# Patient Record
Sex: Male | Born: 1965 | Race: White | Hispanic: No | Marital: Married | Smoking: Never smoker
Health system: Southern US, Community
[De-identification: ages and names within clinical notes are randomized; demographics above are authoritative.]

## PROBLEM LIST (undated history)

## (undated) DIAGNOSIS — R0600 Dyspnea, unspecified: Secondary | ICD-10-CM

## (undated) DIAGNOSIS — E039 Hypothyroidism, unspecified: Secondary | ICD-10-CM

## (undated) DIAGNOSIS — R0609 Other forms of dyspnea: Secondary | ICD-10-CM

## (undated) DIAGNOSIS — S46211A Strain of muscle, fascia and tendon of other parts of biceps, right arm, initial encounter: Secondary | ICD-10-CM

## (undated) DIAGNOSIS — E785 Hyperlipidemia, unspecified: Secondary | ICD-10-CM

## (undated) DIAGNOSIS — I10 Essential (primary) hypertension: Secondary | ICD-10-CM

## (undated) HISTORY — DX: Dyspnea, unspecified: R06.00

## (undated) HISTORY — DX: Other forms of dyspnea: R06.09

## (undated) HISTORY — DX: Hyperlipidemia, unspecified: E78.5

## (undated) HISTORY — PX: SEPTOPLASTY: SUR1290

## (undated) HISTORY — PX: ANTERIOR CERVICAL DECOMP/DISCECTOMY FUSION: SHX1161

## (undated) HISTORY — DX: Essential (primary) hypertension: I10

## (undated) HISTORY — PX: EYE SURGERY: SHX253

---

## 2006-02-02 HISTORY — PX: TENDON REPAIR: SHX5111

## 2006-05-18 ENCOUNTER — Encounter: Admission: RE | Admit: 2006-05-18 | Discharge: 2006-05-18 | Payer: Self-pay | Admitting: Sports Medicine

## 2006-05-24 ENCOUNTER — Ambulatory Visit (HOSPITAL_BASED_OUTPATIENT_CLINIC_OR_DEPARTMENT_OTHER): Admission: RE | Admit: 2006-05-24 | Discharge: 2006-05-24 | Payer: Self-pay | Admitting: Orthopedic Surgery

## 2006-09-13 ENCOUNTER — Encounter (INDEPENDENT_AMBULATORY_CARE_PROVIDER_SITE_OTHER): Payer: Self-pay | Admitting: Family Medicine

## 2006-09-17 ENCOUNTER — Ambulatory Visit: Payer: Self-pay | Admitting: Family Medicine

## 2006-09-17 DIAGNOSIS — R030 Elevated blood-pressure reading, without diagnosis of hypertension: Secondary | ICD-10-CM | POA: Insufficient documentation

## 2006-09-17 DIAGNOSIS — R319 Hematuria, unspecified: Secondary | ICD-10-CM | POA: Insufficient documentation

## 2006-09-18 ENCOUNTER — Encounter (INDEPENDENT_AMBULATORY_CARE_PROVIDER_SITE_OTHER): Payer: Self-pay | Admitting: Family Medicine

## 2006-09-20 ENCOUNTER — Telehealth (INDEPENDENT_AMBULATORY_CARE_PROVIDER_SITE_OTHER): Payer: Self-pay | Admitting: *Deleted

## 2006-09-20 LAB — CONVERTED CEMR LAB
Hemoglobin, Urine: NEGATIVE
Ketones, ur: NEGATIVE mg/dL
Nitrite: NEGATIVE
Protein, ur: NEGATIVE mg/dL
Specific Gravity, Urine: 1.023 (ref 1.005–1.03)
Urobilinogen, UA: 0.2 (ref 0.0–1.0)

## 2006-09-24 ENCOUNTER — Encounter (INDEPENDENT_AMBULATORY_CARE_PROVIDER_SITE_OTHER): Payer: Self-pay | Admitting: *Deleted

## 2006-09-24 ENCOUNTER — Ambulatory Visit: Payer: Self-pay | Admitting: Family Medicine

## 2006-09-26 LAB — CONVERTED CEMR LAB: LDL Cholesterol: 142 mg/dL — ABNORMAL HIGH (ref 0–99)

## 2006-09-27 ENCOUNTER — Telehealth (INDEPENDENT_AMBULATORY_CARE_PROVIDER_SITE_OTHER): Payer: Self-pay | Admitting: *Deleted

## 2008-01-30 ENCOUNTER — Ambulatory Visit: Payer: Self-pay | Admitting: Family Medicine

## 2008-01-30 LAB — CONVERTED CEMR LAB
Eosinophils Absolute: 0 10*3/uL (ref 0.0–0.7)
Eosinophils Relative: 0.6 % (ref 0.0–5.0)
HCT: 45.4 % (ref 39.0–52.0)
Hemoglobin: 15.8 g/dL (ref 13.0–17.0)
MCV: 93.1 fL (ref 78.0–100.0)
Monocytes Absolute: 0.2 10*3/uL (ref 0.1–1.0)
Neutro Abs: 6.1 10*3/uL (ref 1.4–7.7)
Platelets: 262 10*3/uL (ref 150–400)
RDW: 12.4 % (ref 11.5–14.6)

## 2008-01-31 ENCOUNTER — Telehealth (INDEPENDENT_AMBULATORY_CARE_PROVIDER_SITE_OTHER): Payer: Self-pay | Admitting: *Deleted

## 2008-01-31 LAB — CONVERTED CEMR LAB
Eosinophils Relative: 1 % (ref 0–5)
HCT: 46.5 % (ref 39.0–52.0)
Hemoglobin: 15.7 g/dL (ref 13.0–17.0)
Lymphocytes Relative: 11 % — ABNORMAL LOW (ref 12–46)
Lymphs Abs: 0.8 10*3/uL (ref 0.7–4.0)
Monocytes Absolute: 0.6 10*3/uL (ref 0.1–1.0)
Neutro Abs: 5.8 10*3/uL (ref 1.7–7.7)
RBC: 5.12 M/uL (ref 4.22–5.81)
WBC: 7.2 10*3/uL (ref 4.0–10.5)

## 2008-02-06 ENCOUNTER — Ambulatory Visit: Payer: Self-pay | Admitting: Family Medicine

## 2008-05-01 ENCOUNTER — Ambulatory Visit: Payer: Self-pay | Admitting: Family Medicine

## 2008-05-02 ENCOUNTER — Encounter (INDEPENDENT_AMBULATORY_CARE_PROVIDER_SITE_OTHER): Payer: Self-pay | Admitting: *Deleted

## 2008-05-02 LAB — CONVERTED CEMR LAB
Albumin: 4.2 g/dL (ref 3.5–5.2)
Alkaline Phosphatase: 70 units/L (ref 39–117)
Basophils Absolute: 0 10*3/uL (ref 0.0–0.1)
Basophils Relative: 0.4 % (ref 0.0–3.0)
CO2: 32 meq/L (ref 19–32)
Calcium: 9.5 mg/dL (ref 8.4–10.5)
Chloride: 104 meq/L (ref 96–112)
Cholesterol: 235 mg/dL — ABNORMAL HIGH (ref 0–200)
Direct LDL: 149.8 mg/dL
Eosinophils Absolute: 0.1 10*3/uL (ref 0.0–0.7)
Glucose, Bld: 88 mg/dL (ref 70–99)
HCT: 46.5 % (ref 39.0–52.0)
Hemoglobin: 16 g/dL (ref 13.0–17.0)
Lymphocytes Relative: 24.5 % (ref 12.0–46.0)
Lymphs Abs: 1.3 10*3/uL (ref 0.7–4.0)
MCHC: 34.3 g/dL (ref 30.0–36.0)
MCV: 93.1 fL (ref 78.0–100.0)
Monocytes Absolute: 0.5 10*3/uL (ref 0.1–1.0)
Neutro Abs: 3.4 10*3/uL (ref 1.4–7.7)
RBC: 4.99 M/uL (ref 4.22–5.81)
RDW: 12.5 % (ref 11.5–14.6)
Sodium: 142 meq/L (ref 135–145)
TSH: 1.87 microintl units/mL (ref 0.35–5.50)
Total CHOL/HDL Ratio: 3
Total Protein: 7 g/dL (ref 6.0–8.3)
Triglycerides: 71 mg/dL (ref 0.0–149.0)

## 2008-05-28 ENCOUNTER — Ambulatory Visit: Payer: Self-pay | Admitting: Family Medicine

## 2008-05-28 DIAGNOSIS — E785 Hyperlipidemia, unspecified: Secondary | ICD-10-CM | POA: Insufficient documentation

## 2008-05-29 ENCOUNTER — Encounter (INDEPENDENT_AMBULATORY_CARE_PROVIDER_SITE_OTHER): Payer: Self-pay | Admitting: *Deleted

## 2008-05-29 LAB — CONVERTED CEMR LAB
Calcium: 9 mg/dL (ref 8.4–10.5)
GFR calc non Af Amer: 86.83 mL/min (ref >60)
Glucose, Bld: 66 mg/dL — ABNORMAL LOW (ref 70–99)
Potassium: 4.4 meq/L (ref 3.5–5.1)
Sodium: 143 meq/L (ref 135–145)

## 2008-06-20 ENCOUNTER — Telehealth (INDEPENDENT_AMBULATORY_CARE_PROVIDER_SITE_OTHER): Payer: Self-pay | Admitting: *Deleted

## 2008-08-27 ENCOUNTER — Ambulatory Visit: Payer: Self-pay | Admitting: Family Medicine

## 2008-08-27 DIAGNOSIS — M7710 Lateral epicondylitis, unspecified elbow: Secondary | ICD-10-CM | POA: Insufficient documentation

## 2008-08-28 LAB — CONVERTED CEMR LAB
ALT: 23 units/L (ref 0–53)
Albumin: 4.1 g/dL (ref 3.5–5.2)
BUN: 17 mg/dL (ref 6–23)
Bilirubin, Direct: 0.1 mg/dL (ref 0.0–0.3)
Cholesterol: 212 mg/dL — ABNORMAL HIGH (ref 0–200)
Creatinine, Ser: 0.8 mg/dL (ref 0.4–1.5)
Direct LDL: 132 mg/dL
GFR calc non Af Amer: 112.2 mL/min (ref >60)
HDL: 72.6 mg/dL (ref >39.00)
Potassium: 4.4 meq/L (ref 3.5–5.1)
Total Protein: 6.8 g/dL (ref 6.0–8.3)
VLDL: 7 mg/dL (ref 0.0–40.0)

## 2008-08-30 ENCOUNTER — Encounter (INDEPENDENT_AMBULATORY_CARE_PROVIDER_SITE_OTHER): Payer: Self-pay | Admitting: *Deleted

## 2009-02-19 ENCOUNTER — Ambulatory Visit: Payer: Self-pay | Admitting: Family

## 2009-02-19 LAB — CONVERTED CEMR LAB
Albumin: 4.3 g/dL (ref 3.5–5.2)
BUN: 15 mg/dL (ref 6–23)
Cholesterol: 220 mg/dL — ABNORMAL HIGH (ref 0–200)
Creatinine, Ser: 1 mg/dL (ref 0.4–1.5)
GFR calc non Af Amer: 86.53 mL/min (ref >60)
Total Bilirubin: 0.8 mg/dL (ref 0.3–1.2)
Total CHOL/HDL Ratio: 3
Triglycerides: 71 mg/dL (ref 0.0–149.0)
VLDL: 14.2 mg/dL (ref 0.0–40.0)

## 2009-02-21 ENCOUNTER — Telehealth (INDEPENDENT_AMBULATORY_CARE_PROVIDER_SITE_OTHER): Payer: Self-pay | Admitting: *Deleted

## 2009-02-21 DIAGNOSIS — E875 Hyperkalemia: Secondary | ICD-10-CM

## 2009-02-26 ENCOUNTER — Ambulatory Visit: Payer: Self-pay | Admitting: Family

## 2009-02-26 LAB — CONVERTED CEMR LAB
CO2: 29 meq/L (ref 19–32)
Calcium: 9.5 mg/dL (ref 8.4–10.5)
Chloride: 103 meq/L (ref 96–112)
Creatinine, Ser: 0.9 mg/dL (ref 0.4–1.5)
Glucose, Bld: 83 mg/dL (ref 70–99)
Sodium: 140 meq/L (ref 135–145)

## 2009-03-06 ENCOUNTER — Ambulatory Visit: Payer: Self-pay | Admitting: Family

## 2009-03-13 ENCOUNTER — Telehealth (INDEPENDENT_AMBULATORY_CARE_PROVIDER_SITE_OTHER): Payer: Self-pay | Admitting: *Deleted

## 2009-03-14 ENCOUNTER — Ambulatory Visit: Payer: Self-pay | Admitting: Internal Medicine

## 2009-03-14 DIAGNOSIS — I7300 Raynaud's syndrome without gangrene: Secondary | ICD-10-CM

## 2009-03-14 DIAGNOSIS — R5381 Other malaise: Secondary | ICD-10-CM

## 2009-03-14 DIAGNOSIS — R091 Pleurisy: Secondary | ICD-10-CM | POA: Insufficient documentation

## 2009-03-14 DIAGNOSIS — R5383 Other fatigue: Secondary | ICD-10-CM

## 2009-03-18 LAB — CONVERTED CEMR LAB
Basophils Relative: 0 % (ref 0.0–3.0)
Eosinophils Absolute: 0.1 10*3/uL (ref 0.0–0.7)
Eosinophils Relative: 1.9 % (ref 0.0–5.0)
HCT: 45.3 % (ref 39.0–52.0)
Lymphs Abs: 1 10*3/uL (ref 0.7–4.0)
MCHC: 33.1 g/dL (ref 30.0–36.0)
MCV: 95 fL (ref 78.0–100.0)
Monocytes Absolute: 0.3 10*3/uL (ref 0.1–1.0)
Neutrophils Relative %: 74 % (ref 43.0–77.0)
RBC: 4.77 M/uL (ref 4.22–5.81)

## 2009-05-20 ENCOUNTER — Ambulatory Visit: Payer: Self-pay | Admitting: Family Medicine

## 2009-08-19 ENCOUNTER — Ambulatory Visit: Payer: Self-pay | Admitting: Family Medicine

## 2009-08-20 ENCOUNTER — Telehealth (INDEPENDENT_AMBULATORY_CARE_PROVIDER_SITE_OTHER): Payer: Self-pay | Admitting: *Deleted

## 2009-08-20 LAB — CONVERTED CEMR LAB
ALT: 26 units/L (ref 0–53)
Albumin: 4.4 g/dL (ref 3.5–5.2)
Basophils Relative: 0.6 % (ref 0.0–3.0)
Bilirubin, Direct: 0.2 mg/dL (ref 0.0–0.3)
CO2: 31 meq/L (ref 19–32)
Chloride: 107 meq/L (ref 96–112)
Cholesterol: 211 mg/dL — ABNORMAL HIGH (ref 0–200)
Creatinine, Ser: 0.9 mg/dL (ref 0.4–1.5)
Direct LDL: 97.5 mg/dL
Eosinophils Absolute: 0.1 10*3/uL (ref 0.0–0.7)
Eosinophils Relative: 1.9 % (ref 0.0–5.0)
HCT: 44 % (ref 39.0–52.0)
Hemoglobin: 15.1 g/dL (ref 13.0–17.0)
MCHC: 34.3 g/dL (ref 30.0–36.0)
MCV: 93.8 fL (ref 78.0–100.0)
Monocytes Absolute: 0.4 10*3/uL (ref 0.1–1.0)
Neutro Abs: 2.8 10*3/uL (ref 1.4–7.7)
Potassium: 4.5 meq/L (ref 3.5–5.1)
RBC: 4.69 M/uL (ref 4.22–5.81)
Sodium: 141 meq/L (ref 135–145)
Testosterone: 463.81 ng/dL (ref 350.00–890.00)
Total CHOL/HDL Ratio: 2
Total Protein: 7.1 g/dL (ref 6.0–8.3)
VLDL: 8.2 mg/dL (ref 0.0–40.0)
WBC: 4.6 10*3/uL (ref 4.5–10.5)

## 2009-12-16 ENCOUNTER — Ambulatory Visit: Payer: Self-pay | Admitting: Family Medicine

## 2009-12-16 ENCOUNTER — Encounter: Payer: Self-pay | Admitting: Family Medicine

## 2009-12-16 DIAGNOSIS — J45990 Exercise induced bronchospasm: Secondary | ICD-10-CM

## 2009-12-18 ENCOUNTER — Telehealth (INDEPENDENT_AMBULATORY_CARE_PROVIDER_SITE_OTHER): Payer: Self-pay | Admitting: *Deleted

## 2010-02-07 ENCOUNTER — Ambulatory Visit
Admission: RE | Admit: 2010-02-07 | Discharge: 2010-02-07 | Payer: Self-pay | Source: Home / Self Care | Attending: Internal Medicine | Admitting: Internal Medicine

## 2010-02-07 ENCOUNTER — Encounter: Payer: Self-pay | Admitting: Internal Medicine

## 2010-02-07 DIAGNOSIS — R0989 Other specified symptoms and signs involving the circulatory and respiratory systems: Secondary | ICD-10-CM

## 2010-02-07 DIAGNOSIS — R0609 Other forms of dyspnea: Secondary | ICD-10-CM | POA: Insufficient documentation

## 2010-02-11 ENCOUNTER — Telehealth (INDEPENDENT_AMBULATORY_CARE_PROVIDER_SITE_OTHER): Payer: Self-pay | Admitting: *Deleted

## 2010-02-12 ENCOUNTER — Emergency Department (HOSPITAL_BASED_OUTPATIENT_CLINIC_OR_DEPARTMENT_OTHER)
Admission: EM | Admit: 2010-02-12 | Discharge: 2010-02-12 | Payer: Self-pay | Source: Home / Self Care | Admitting: Emergency Medicine

## 2010-02-24 ENCOUNTER — Encounter: Payer: Self-pay | Admitting: Internal Medicine

## 2010-02-24 ENCOUNTER — Ambulatory Visit (HOSPITAL_COMMUNITY)
Admission: RE | Admit: 2010-02-24 | Discharge: 2010-02-24 | Payer: Self-pay | Source: Home / Self Care | Attending: Internal Medicine | Admitting: Internal Medicine

## 2010-03-04 DIAGNOSIS — R0602 Shortness of breath: Secondary | ICD-10-CM

## 2010-03-04 NOTE — Assessment & Plan Note (Signed)
Summary: bp ck and bmp/alr   Vital Signs:  Patient profile:   45 year old male Weight:      174 pounds Pulse rate:   74 / minute BP sitting:   120 / 86  (left arm)  Vitals Entered By: Doristine Devoid (March 06, 2009 9:22 AM) CC: f/u from prior visit and bp ck    CC:  f/u from prior visit and bp ck .  History of Present Illness: Duane Kirk presents today for follow up.  He was seen last week for URI and OM and continues to take amoxicillin for OM.  Notes that his symptoms have improved, but still with "tickle" cough which is dry.  Also "just haven't gotten my energy back."   He also had a follow up BMET due to hyperkalemia.  His Lisinopril was changed to norvasc.  F/u K was normal.  BP is actually ok off of Novasc.  Allergies: No Known Drug Allergies  Review of Systems       Denies ear pain or fever. + dry cough  Physical Exam  General:  Well-developed,well-nourished,in no acute distress; alert,appropriate and cooperative throughout examination Eyes:  No corneal or conjunctival inflammation noted. EOMI. Perrla. Funduscopic exam benign, without hemorrhages, exudates or papilledema. Vision grossly normal. Ears:  External ear exam shows no significant lesions or deformities.  Otoscopic examination reveals clear canals, tympanic membranes are intact bilaterally without bulging, retraction, inflammation or discharge. Hearing is grossly normal bilaterally. Neck:  No deformities, masses, or tenderness noted. Lungs:  Normal respiratory effort, chest expands symmetrically. Lungs are clear to auscultation, no crackles or wheezes. Heart:  Normal rate and regular rhythm. S1 and S2 normal without gallop, murmur, click, rub or other extra sounds.   Impression & Recommendations:  Problem # 1:  VIRAL INFECTION, ACUTE (ICD-079.99) Assessment Improved  His updated medication list for this problem includes:    Naprosyn 500 Mg Tabs (Naproxen) .Marland Kitchen... 1 two times a day x7-10 days and then as  needed.  take w/ food.    Tessalon Perles 100 Mg Caps (Benzonatate) ..... One cap by mouth three times a day as needed cough  Problem # 2:  OTITIS MEDIA (ICD-382.9) Assessment: Improved Plan to complete amoxicillin His updated medication list for this problem includes:    Naprosyn 500 Mg Tabs (Naproxen) .Marland Kitchen... 1 two times a day x7-10 days and then as needed.  take w/ food.    Amoxicillin 500 Mg Cap (Amoxicillin) .Marland Kitchen... Take 1 capsule by mouth three times a day x 10 days  Problem # 3:  HYPERPOTASSEMIA (ICD-276.7) Assessment: Improved Now off ace-  f/u BMET is normal  Problem # 4:  HYPERTENSION, BENIGN ESSENTIAL (ICD-401.1) Assessment: Improved BP has been good last two visits off of all BP meds, will continue low sodium diet.    Patient has BP cuff at home an instructed to monitor BP and call if his blood pressure starts to rise.  Otherwise will have patient f/u in 2 months for a BP check- instructed patient to remain off of Norvasc.   The following medications were removed from the medication list:    Norvasc 5 Mg Tabs (Amlodipine besylate) ..... One tablet by mouth daily  BP today: 120/86 Prior BP: 120/60 (02/26/2009)  Labs Reviewed: K+: 4.6 (02/26/2009) Creat: : 0.9 (02/26/2009)   Chol: 220 (02/19/2009)   HDL: 85.60 (02/19/2009)   LDL: 142 (09/24/2006)   TG: 71.0 (02/19/2009)  Complete Medication List: 1)  Zyrtec Allergy 10 Mg Tabs (Cetirizine  hcl) .... Take one tablet daily 2)  Multivitamins Caps (Multiple vitamin) 3)  Fish Oil 1200 Mg Caps (Omega-3 fatty acids) .... Take daily 4)  Flax Seed Oil 1000 Mg Caps (Flaxseed (linseed)) .... Take daily 5)  Vitamin C 500 Mg Tabs (Ascorbic acid) .... Take daily 6)  Naprosyn 500 Mg Tabs (Naproxen) .Marland Kitchen.. 1 two times a day x7-10 days and then as needed.  take w/ food. 7)  Amoxicillin 500 Mg Cap (Amoxicillin) .... Take 1 capsule by mouth three times a day x 10 days 8)  Tessalon Perles 100 Mg Caps (Benzonatate) .... One cap by mouth three times a  day as needed cough  Patient Instructions: 1)  Please schedule a follow-up appointment in 2 months. 2)  Call if your energy does not return to normal in 1 week or if your cough does not resolve in 1 week.  Please complete your amoxicillin. 3)  Continue a low sodium diet.  Prescriptions: TESSALON PERLES 100 MG CAPS (BENZONATATE) one cap by mouth three times a day as needed cough  #30 x 0   Entered and Authorized by:   Lemont Fillers FNP   Signed by:   Lemont Fillers FNP on 03/06/2009   Method used:   [facility]ronically to        [facility] [phone]* (retail)       [address]       [place], [place]  [address]       Ph: [phone]       Fax: [phone]   RxID:   [id number]

## 2010-03-04 NOTE — Progress Notes (Signed)
Summary: Lab results  Phone Note Outgoing Call   Call placed by: Kathrynn Speed CMA,  August 20, 2009 10:06 AM Summary of Call: Lft msg to call office will inform pt that:  labs look good- including testosterone.  discussed testosterone level w/ endocrinologist and he felt that level is appropriate for pt's age.  repeat labs in 1 year per Dr. Beverely Low Initial call taken by: Kathrynn Speed CMA,  August 20, 2009 10:07 AM  Follow-up for Phone Call        mailed labs .......Marland KitchenDoristine Devoid CMA  August 20, 2009 2:55 PM      Appended Document: Lab results Pt called back I informed him of his levels & told him that labs have been mailed to him............Marland Kitchen

## 2010-03-04 NOTE — Progress Notes (Signed)
Summary: discuss labs-lmom  Phone Note Outgoing Call   Summary of Call: Please call patient and tell him to discontinue lisinopril. I think this is causing his potassium to rise. Start norvasc 5 mg by mouth daily, follow up in 1 week for BP check and BMET.(276.7) Also please advise patient to follow a low cholesterol diet. Initial call taken by: Lemont Fillers FNP,  February 21, 2009 12:56 PM  Follow-up for Phone Call        left message on machine ..........Marland KitchenDoristine Devoid  February 21, 2009 1:05 PM   Additional Follow-up for Phone Call Additional follow up Details #1::        pt called back informed pt of his lab and new rx. and ov has been schduled , Cholesterol diet mailed to patient .Kandice Hams  February 21, 2009 2:15 PM  Additional Follow-up by: Kandice Hams,  February 21, 2009 2:15 PM  New Problems: HYPERPOTASSEMIA (ICD-276.7)   New Problems: HYPERPOTASSEMIA (ICD-276.7) New/Updated Medications: NORVASC 5 MG TABS (AMLODIPINE BESYLATE) one tablet by mouth daily dPrescriptions: NORVASC 5 MG TABS (AMLODIPINE BESYLATE) one tablet by mouth daily  #30 x 0   Entered and Authorized by:   Lemont Fillers FNP   Signed by:   Lemont Fillers FNP on 02/21/2009   Method used:   Electronically to        [facility] [phone]* (retail)       [address]       [place], [place]  [address]       Ph: [phone]       Fax: [phone]   RxID:   [id number]

## 2010-03-04 NOTE — Progress Notes (Signed)
Summary: CHEST DISCOMFORT WHEN COUGHING OR SNEEZING  Phone Note Call from Patient Call back at [phone] CELL   Caller: Patient Summary of Call: FINISHED ANTIBIOTICS--FOLLOWUP=BETTER, BUT NOT GREAT  NOW HAS DEVELOPED PAIN IN LOWER LEFT SIDE OF CHEST----FEELS DISCOMFORT WHEN HE COUGHS OR TAKES DEEP BREATH OR SNEEZES----DISCOMFORT ALL THE TIME, BUT WORSE WITH ABOVE SYMPTOMS  PLEASE CALL CELL NUMBER TO ADVISE BECAUSE HE HAS TO TAKE OFF FULL DAY OF WORK TO COME IN AND SEE DOCTOR Initial call taken by: Jerolyn Shin,  March 13, 2009 12:53 PM  Follow-up for Phone Call        spoke w/ patient informed that per Melissa to have him come in to the office just to make sure he hasn't developed pneuomia and we can send for chest xray based on office visit appt scheduled to see hop tomorrow afternoon since patient was unable to come into office to see Melissa today.Marland KitchenMarland KitchenDoristine Devoid  March 13, 2009 1:48 PM

## 2010-03-04 NOTE — Progress Notes (Signed)
Summary: pulm referral   Phone Note Call from Patient Call back at Work Phone [phone]   Caller: Patient Summary of Call: spoke w/ patient says that albuterol inhaler wasn't helping and would like to go ahead w/ pulmonary referral   Follow-up for Phone Call        spoke w/ patient aware referral in process....Marland KitchenMarland KitchenDoristine Devoid CMA  December 18, 2009 2:30 PM

## 2010-03-04 NOTE — Assessment & Plan Note (Signed)
Summary: RTO 6 MONTHS & LAB/CBS   Vital Signs:  Patient profile:   45 year old male Weight:      176.4 pounds Pulse rate:   60 / minute BP sitting:   100 / 80  Vitals Entered By: Kandice Hams (February 19, 2009 8:10 AM) CC: 6 month followup pt is fasting   CC:  6 month followup pt is fasting.  History of Present Illness: Patient presents today for follow up.  Notes that he has been following a low cholesterol diet and taking fish oil. He is taking ACE as prescribed.  Has gained 9 pounds according to our scale- though he says this scale is considerably higher than his scale at home.    Allergies: No Known Drug Allergies  Review of Systems       notes that bilateral elbows are feeling better- heavy lifting (works for [employer]), denies HA, denies hematuria  Physical Exam  General:  Well-developed,well-nourished,in no acute distress; alert,appropriate and cooperative throughout examination Neck:  No deformities, masses, or tenderness noted. Lungs:  Normal respiratory effort, chest expands symmetrically. Lungs are clear to auscultation, no crackles or wheezes. Heart:  Normal rate and regular rhythm. S1 and S2 normal without gallop, murmur, click, rub or other extra sounds.   Impression & Recommendations:  Problem # 1:  HYPERLIPIDEMIA (ICD-272.4) Assessment Comment Only Patient has been following low cholesterol diet and exercise- will check f/u FLP Orders: Venipuncture (95188) TLB-Hepatic/Liver Function Pnl (80076-HEPATIC) TLB-Lipid Panel (80061-LIPID)  Problem # 2:  HYPERTENSION, BENIGN ESSENTIAL (ICD-401.1) 135/84 follow up BP (I doubt accuracy of first read).  Plan to have patient return for nurse visit in 1 month. Continue lisinopril for now. Will check BMET to evaluate renal fxn and K+    Lisinopril 10 Mg Tabs (Lisinopril) .Marland Kitchen... Take 1 tab by mouth daily  Orders: Venipuncture (41660) TLB-BMP (Basic Metabolic Panel-BMET) (80048-METABOL)  Complete Medication List: 1)   Zyrtec Allergy 10 Mg Tabs (Cetirizine hcl) .... Take one tablet daily 2)  Multivitamins Caps (Multiple vitamin) 3)  Lisinopril 10 Mg Tabs (Lisinopril) .... Take 1 tab by mouth daily 4)  Fish Oil 1200 Mg Caps (Omega-3 fatty acids) .... Take daily 5)  Flax Seed Oil 1000 Mg Caps (Flaxseed (linseed)) .... Take daily 6)  Vitamin C 500 Mg Tabs (Ascorbic acid) .... Take daily 7)  Naprosyn 500 Mg Tabs (Naproxen) .Marland Kitchen.. 1 two times a day x7-10 days and then as needed.  take w/ food.  Patient Instructions: 1)  Please complete your lab work prior to leaving 2)  Limit your Sodium (Salt). 3)  Follow up in 1 month for a nurse visit 4)  Follow up in 3 months for a full visit with Dr. Beverely Low

## 2010-03-04 NOTE — Assessment & Plan Note (Signed)
Summary: COUGH NO BETTER/CDJ   Vital Signs:  Patient profile:   45 year old male Weight:      174.2 pounds Temp:     98.2 degrees F oral Pulse rate:   60 / minute Resp:     14 per minute BP sitting:   128 / 80  (left arm) Cuff size:   large  Vitals Entered By: Shonna Chock (March 14, 2009 1:44 PM) CC: Ongoing concerns: cough, chest congestion and pain on right side of chest (due to cough) Comments REVIEWED MED LIST, PATIENT AGREED DOSE AND INSTRUCTION CORRECT    CC:  Ongoing concerns: cough and chest congestion and pain on right side of chest (due to cough).  History of Present Illness: S/P course of Amox Rxed by Ms. Val EagleLendell Caprice he remains fatigued . On 03/11/2009 he devloped pleruritic  R sided chest pain. Cough improved but never resolved completely, "like throat clearing". Off ACE-i since 02/18 for hyperkalemia. No PMH of asthma; he has never smoked. BP controlled off meds. He is concerned about color change in finger with cold exposure on job([employer])  Allergies (verified): No Known Drug Allergies  Review of Systems General:  Complains of fatigue; denies chills, fever, and sweats. Resp:  Denies nasal congestion, postnasal drainage, and sinus pressure; No purulence, facial pain or headache. Neuro:  Denies bluish discoloration of lips or nails, difficulty breathing at night, and difficulty breathing while lying down; Single digit blanching with cold exposure. Endo:  Denies coughing up blood, shortness of breath, sputum productive, and wheezing. GI:  Denies indigestion; No active ERD symptoms. Eyes:  Complains of changes in color of skin.  Physical Exam  General:  well-nourished,in no acute distress; alert,appropriate and cooperative throughout examination Ears:  External ear exam shows no significant lesions or deformities.  Otoscopic examination reveals clear canals, tympanic membranes are intact bilaterally without bulging, retraction, inflammation or discharge. Hearing is  grossly normal bilaterally. Nose:  External nasal examination shows no deformity or inflammation. Nasal mucosa are pink and moist without lesions or exudates. Mouth:  Oral mucosa and oropharynx without lesions or exudates.  Teeth in good repair. Chest Wall:  no tenderness.   Lungs:  Normal respiratory effort, chest expands symmetrically. Lungs are clear to auscultation, no crackles or wheezes.No rub Extremities:  No clubbing, cyanosis, edema. Neg Homan's Skin:  Intact without suspicious lesions or rashes Cervical Nodes:  No lymphadenopathy noted Axillary Nodes:  No palpable lymphadenopathy Psych:  memory intact for recent and remote, normally interactive, and good eye contact.     Impression & Recommendations:  Problem # 1:  COUGH (ICD-786.2)  upper airway localization Orders: Est. Patient Level III (16109) Venipuncture (60454) TLB-CBC Platelet - w/Differential (85025-CBCD) Est. Patient Level IV (09811)  His updated medication list for this problem includes:    Prednisone 20 Mg Tabs (Prednisone) .Marland Kitchen... 1 two times a day with food  Problem # 2:  PLEURISY (ICD-511.0)  Orders: Est. Patient Level III (91478) Venipuncture (29562) TLB-CBC Platelet - w/Differential (85025-CBCD) Est. Patient Level IV (13086)  Problem # 3:  FATIGUE (ICD-780.79)  Orders: Est. Patient Level III (57846) Venipuncture (96295) TLB-TSH (Thyroid Stimulating Hormone) (84443-TSH) TLB-CBC Platelet - w/Differential (85025-CBCD) TLB-T4 (Thyrox), Free (28413-KG4W) Est. 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 Patient Level IV (53664)  Complete Medication List: 1)  Zyrtec Allergy 10 Mg Tabs (  Cetirizine hcl) .... Take one tablet daily 2)  Multivitamins Caps (Multiple vitamin) 3)  Fish Oil 1200 Mg Caps (Omega-3 fatty acids) .... Take daily 4)   Vitamin C 500 Mg Tabs (Ascorbic acid) .... Take daily 5)  Tessalon Perles 100 Mg Caps (Benzonatate) .... One cap by mouth three times a day as needed cough 6)  Azithromycin 250 Mg Tabs (Azithromycin) .... As per pack 7)  Prednisone 20 Mg Tabs (Prednisone) .Marland Kitchen.. 1 two times a day with food  Patient Instructions: 1)  Drink as much fluid as you can tolerate for the next few days. QVAR 40 mg 2 puffs every 12 hrs (sample provided) ; gargle & spit after use. CXray if symptoms persist.Aleve 1-2 every 8-12 hrs as needed  for pleuritic pain. Finger protection as discussed for Raynaud's phenomenon Prescriptions: PREDNISONE 20 MG TABS (PREDNISONE) 1 two times a day with food  #14 x 0   Entered and Authorized by:   Marga Melnick MD   Signed by:   Marga Melnick MD on 03/14/2009   Method used:   Faxed to ...       [facility] [phone]* (retail)       [address]       [place], [place]  [address]       Ph: [phone]       Fax: [phone]   RxID:   [id number] AZITHROMYCIN 250 MG TABS (AZITHROMYCIN) as per pack  #1 x 0   Entered and Authorized by:   Marga Melnick MD   Signed by:   Marga Melnick MD on 03/14/2009   Method used:   Faxed to ...       [facility] [phone]* (retail)       [address]       [place], [place]  [address]       Ph: [phone]       Fax: [phone]   RxID:   [id number]

## 2010-03-04 NOTE — Assessment & Plan Note (Signed)
Summary: 3 MONTH OV//PH   Vital Signs:  Patient profile:   45 year old male Weight:      172 pounds Pulse rate:   54 / minute BP sitting:   126 / 80  (left arm)  Vitals Entered By: Doristine Devoid (May 20, 2009 9:23 AM) CC: 3 month roa    History of Present Illness: 45 yo man here today for f/u on BP.  no longer on meds.  no CP, SOB, HAs, visual changes, edema.  BP WNL today.  Current Medications (verified): 1)  Zyrtec Allergy 10 Mg  Tabs (Cetirizine Hcl) .... Take One Tablet Daily 2)  Multivitamins   Caps (Multiple Vitamin) 3)  Fish Oil 1200 Mg Caps (Omega-3 Fatty Acids) .... Take Daily 4)  Vitamin C 500 Mg Tabs (Ascorbic Acid) .... Take Daily 5)  Cholest Off 450 Mg Tabs (Plant Sterols and Stanols) .... 2 Tablets Two Times A Day  Allergies (verified): No Known Drug Allergies  Past History:  Past Medical History: Last updated: 08/27/2008 HTN Hyperlipidemia  Review of Systems      See HPI  Physical Exam  General:  well-nourished,in no acute distress; alert,appropriate and cooperative throughout examination Lungs:  Normal respiratory effort, chest expands symmetrically. Lungs are clear to auscultation, no crackles or wheezes. Heart:  Normal rate and regular rhythm. S1 and S2 normal without gallop, murmur, click, rub or other extra sounds. Pulses:  +2 carotid, radial, DP Extremities:  No clubbing, cyanosis, edema.    Impression & Recommendations:  Problem # 1:  ABNORMAL FINDINGS, ELEVATED BP W/O HTN (ICD-796.2) Assessment Improved BP now WNL off all medication.  no reason to resume or add meds.  reviewed red flags that should prompt return.  pt expressed understanding.  will continue to follow.  Complete Medication List: 1)  Zyrtec Allergy 10 Mg Tabs (Cetirizine hcl) .... Take one tablet daily 2)  Multivitamins Caps (Multiple vitamin) 3)  Fish Oil 1200 Mg Caps (Omega-3 fatty acids) .... Take daily 4)  Vitamin C 500 Mg Tabs (Ascorbic acid) .... Take daily 5)   Cholest Off 450 Mg Tabs (Plant sterols and stanols) .... 2 tablets two times a day  Patient Instructions: 1)  Please schedule your physical for July 2)  Your blood pressure looks great! 3)  Keep up the good work on diet and exercise- you look great! 4)  Call with any questions or concerns 5)  Happy Odis Luster!

## 2010-03-04 NOTE — Assessment & Plan Note (Signed)
Summary: cpx/kdc   Vital Signs:  Patient profile:   45 year old male Height:      68 inches Weight:      166.6 pounds Temp:     94.7 degrees F oral BP sitting:   130 / 90  (left arm) Cuff size:   regular  Vitals Entered By: Kathrynn Speed CMA (August 19, 2009 8:16 AM) CC: Cpx   History of Present Illness: 45 yo man here today for CPE.  no concerns about health.  Preventive Screening-Counseling & Management  Alcohol-Tobacco     Alcohol drinks/day: <1     Smoking Status: never  Caffeine-Diet-Exercise     Does Patient Exercise: yes     Type of exercise: weights, running      Drug Use:  never.    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 Take One Tablet Daily 2)  Multivitamins   Caps (Multiple Vitamin) 3)  Fish Oil 1200 Mg Caps (Omega-3 Fatty Acids) .... Take Daily 4)  Vitamin C 500 Mg Tabs (Ascorbic Acid) .... Take Daily 5)  Cholest Off 450 Mg Tabs (Plant Sterols and Stanols) .... 2 Tablets Two Times A Day  Allergies (verified): No Known Drug Allergies  Past History:  Past Medical History: Last updated: 08/27/2008 HTN Hyperlipidemia  Past Surgical History: Last updated: 09/17/2006 Tendon repair in left arm  Family History: Family History Breast cancer 1st degree relative  Father-Lymphoma no family hx of colon cancer  Social History: Occupation: [employer] Divorced Never Smoked Alcohol use-yes Drug use-no Regular exercise-yes  Review of Systems  The patient denies anorexia, fever, weight loss, weight gain, vision loss, decreased hearing,  hoarseness, chest pain, syncope, dyspnea on exertion, peripheral edema, prolonged cough, headaches, abdominal pain, melena, hematochezia, severe indigestion/heartburn, hematuria, suspicious skin lesions, depression, abnormal bleeding, enlarged lymph nodes, and testicular masses.    Physical Exam  General:  well-nourished,in no acute distress; alert,appropriate and cooperative throughout examination Head:  Normocephalic and atraumatic without obvious abnormalities. No apparent alopecia or balding. Eyes:  No corneal or conjunctival inflammation noted. EOMI. Perrla. Funduscopic exam benign, without hemorrhages, exudates or papilledema. Vision grossly normal. Ears:  External ear exam shows no significant lesions or deformities.  Otoscopic examination reveals clear canals, tympanic membranes are intact bilaterally without bulging, retraction, inflammation or discharge. Hearing is grossly normal bilaterally. Nose:  External nasal examination shows no deformity or inflammation. Nasal mucosa are pink and moist without lesions or exudates. Mouth:  Oral mucosa and oropharynx without lesions or exudates.  Teeth in good repair. Neck:  No deformities, masses, or tenderness noted. Lungs:  Normal respiratory effort, chest expands symmetrically. Lungs are clear to auscultation, no crackles or wheezes. Heart:  Normal rate and regular rhythm. S1 and S2 normal without gallop, murmur, click, rub or other extra sounds. Abdomen:  soft, NT/ND, +BS.  small lipoma superior and to L of umbilicus Genitalia:  Testes bilaterally descended without nodularity, tenderness or masses. No scrotal masses or lesions. No penis lesions or urethral discharge. Msk:  R AC joint markedly elevated, no TTP remainder of exam WNL Pulses:  +  2 carotid, radial, DP Extremities:  No clubbing, cyanosis, edema.  Neurologic:  No cranial nerve deficits noted. Station and gait are normal. Plantar reflexes are down-going bilaterally. DTRs are symmetrical  throughout. Sensory, motor and coordinative functions appear intact. Skin:  Intact without suspicious lesions or rashes Cervical Nodes:  No lymphadenopathy noted Axillary Nodes:  No palpable lymphadenopathy Inguinal Nodes:  No significant adenopathy Psych:  memory intact for recent and remote, normally interactive, and good eye contact.     Impression & Recommendations:  Problem # 1:  PHYSICAL EXAMINATION (ICD-V70.0) Assessment Unchanged pt's PE WNL w/ exception of R AC joint.  pt has ortho- told him to call and schedule appt.  check labs.  anticipatory guidance provided. Orders: Venipuncture (16109) TLB-Lipid Panel (80061-LIPID) TLB-BMP (Basic Metabolic Panel-BMET) (80048-METABOL) TLB-CBC Platelet - w/Differential (85025-CBCD) TLB-Hepatic/Liver Function Pnl (80076-HEPATIC) TLB-TSH (Thyroid Stimulating Hormone) (84443-TSH)  Problem # 2:  FATIGUE (ICD-780.79) Assessment: Unchanged pt questions whether testosterone level is contributing to feelings of fatigue.  previous w/u was (-) but testosterone was never checked. Orders: TLB-Testosterone, Total (84403-TESTO)  Complete Medication List: 1)  Zyrtec Allergy 10 Mg Tabs (Cetirizine hcl) .... Take one tablet daily 2)  Multivitamins Caps (Multiple vitamin) 3)  Fish Oil 1200 Mg Caps (Omega-3 fatty acids) .... Take daily 4)  Vitamin C 500 Mg Tabs (Ascorbic acid) .... Take daily 5)  Cholest Off 450 Mg Tabs (Plant sterols and stanols) .... 2 tablets two times a day  Patient Instructions: 1)  Follow up in 3-4 months to recheck blood pressure 2)  Please call Delbert Harness to check your R shoulder 3)  Keep up the good work on diet and exercise- your exam looks great! 4)  We'll notify you of your lab results 5)  Call with any questions or concerns 6)  Check your blood pressure periodically- if consistently > 140/90, call me 7)  Have a great summer!  Appended Document: cpx/kdc    Clinical Lists Changes  Orders: Added new Service  order of Specimen Handling (60454) - Signed

## 2010-03-04 NOTE — Assessment & Plan Note (Signed)
Summary: REACTION TO BP MEDS/KDC   Vital Signs:  Patient profile:   45 year old male Height:      69.25 inches Weight:      173.38 pounds BMI:     25.51 Temp:     100.3 degrees F oral Pulse rate:   108 / minute BP sitting:   120 / 60  Vitals Entered By: Kandice Hams (February 26, 2009 11:29 AM) CC: c/o dry cough,headache from coughing throat dry, sx started after taking Norvasc, did not take today yet   CC:  c/o dry cough, headache from coughing throat dry, sx started after taking Norvasc, and did not take today yet.  History of Present Illness: Duane Kirk is a 45 year old male who presents today with c/o cough/headache.  Notes throat is "raw".  Notes that he had his daughter drive him over due weakness.  Notes that daughter had strep 3 weeks ago.  Notes that he has felt warm- but has not taken his temperature.  He started norvasc on Friday, these symptoms started on sunday night.  Did not take norvasc today.  HTN- Lisinopril was discontinued due to hyperkalemia.    Allergies: No Known Drug Allergies  Review of Systems       + HA + cough, + subjective temp.    Physical Exam  General:  ill appearing white male in NAD Eyes:  Mild erythema and bulging bilaterally  Mouth:  mild pharyngeal  Neck:  no cervical LAD Lungs:  Normal respiratory effort, chest expands symmetrically. Lungs are clear to auscultation, no crackles or wheezes. Heart:  Normal rate and regular rhythm. S1 and S2 normal without gallop, murmur, click, rub or other extra sounds.   Impression & Recommendations:  Problem # 1:  VIRAL INFECTION, ACUTE (ICD-079.99) Assessment New  will add tessalon for cough,  rapid flu and rapid strep are negative.  Plan fluids motrin as needed , rest  His updated medication list for this problem includes:    Naprosyn 500 Mg Tabs (Naproxen) .Marland Kitchen... 1 two times a day x7-10 days and then as needed.  take w/ food.    Tessalon Perles 100 Mg Caps (Benzonatate) ..... One cap by mouth  three times a day as needed cough  Problem # 2:  HYPERPOTASSEMIA (ICD-276.7)  ACE on hold.  Will repeat BMET  Orders: Venipuncture (16109) TLB-BMP (Basic Metabolic Panel-BMET) (80048-METABOL)  Problem # 3:  OTITIS MEDIA (ICD-382.9) Assessment: New Suspect early OM, will treat with amoxicillin  Problem # 4:  HYPERTENSION, BENIGN ESSENTIAL (ICD-401.1) Assessment: Comment Only BP is actually a bit low today and patient did not take Norvasc.  Patient was concerned that these symptoms were due to Norvasc.  I doubt that given fever/OM that this is the case.  Will hold for now though as patient's BP does not seem to be requiring. f/u in 1 week. His updated medication list for this problem includes:    Norvasc 5 Mg Tabs (Amlodipine besylate) ..... One tablet by mouth daily  Complete Medication List: 1)  Zyrtec Allergy 10 Mg Tabs (Cetirizine hcl) .... Take one tablet daily 2)  Multivitamins Caps (Multiple vitamin) 3)  Norvasc 5 Mg Tabs (Amlodipine besylate) .... One tablet by mouth daily 4)  Fish Oil 1200 Mg Caps (Omega-3 fatty acids) .... Take daily 5)  Flax Seed Oil 1000 Mg Caps (Flaxseed (linseed)) .... Take daily 6)  Vitamin C 500 Mg Tabs (Ascorbic acid) .... Take daily 7)  Naprosyn 500 Mg Tabs (Naproxen) .Marland KitchenMarland KitchenMarland Kitchen  1 two times a day x7-10 days and then as needed.  take w/ food. 8)  Amoxicillin 500 Mg Cap (Amoxicillin) .... Take 1 capsule by mouth three times a day x 10 days 9)  Tessalon Perles 100 Mg Caps (Benzonatate) .... One cap by mouth three times a day as needed cough  Patient Instructions: 1)  Start amoxicillin for ear infection,  tessalon caps as needed for cough.   2)  Take 400-600mg  of Ibuprofen (Advil, Motrin) with food every 4-6 hours as needed for relief of pain or comfort of fever. 3)  Please continue to hold the norvasc for now- follow up in 1 week, sooner if fever over 101, if symptoms worsen, or if they do not improve. 4)  Drink plenty of fluids, and get lots of rest.   5)   Hope you feel better! Prescriptions: AMOXICILLIN 500 MG CAP (AMOXICILLIN) Take 1 capsule by mouth three times a day X 10 days  #30 x 0   Entered and Authorized by:   Lemont Fillers FNP   Signed by:   Lemont Fillers FNP on 02/26/2009   Method used:   Electronically to        [facility] [phone]* (retail)       [address]       [place], [place]  [address]       Ph: [phone]       Fax: [phone]   RxID:   [id number]   Laboratory Results    Other Tests  Rapid Strep: negative Influenza A: negative

## 2010-03-04 NOTE — Assessment & Plan Note (Signed)
Summary: bp check/cbs   Vital Signs:  Patient profile:   45 year old male Weight:      164 pounds BMI:     25.03 Pulse rate:   62 / minute BP sitting:   112 / 78  (left arm)  Vitals Entered By: Doristine Devoid CMA (December 16, 2009 9:22 AM) CC: bp check and discuss possible exercised induced asthma   History of Present Illness: 45 yo man here today for   1) BP- normal today.  no CP, HAs, visual changes, edema.  + SOB (see below)  2) SOB w/ exertion- has stopped lifting and is now doing mostly cardio.  doing triathlons- sxs most noticeable during swimming (indoor).  feels as if he can't get a deep breath.  also experiences similar sxs during running.  doesn't recall similar sxs when he was younger.  will still have sensation of decreased air exchange even when not exercising.  Preventive Screening-Counseling & Management  Alcohol-Tobacco     Smoking Status: never  Caffeine-Diet-Exercise     Does Patient Exercise: yes     Type of exercise: triathlons      Drug Use:  never.    Current Medications (verified): 1)  Zyrtec Allergy 10 Mg  Tabs (Cetirizine Hcl) .... Take One Tablet Daily 2)  Multivitamins   Caps (Multiple Vitamin) 3)  Fish Oil 1200 Mg Caps (Omega-3 Fatty Acids) .... Take Daily 4)  Vitamin C 500 Mg Tabs (Ascorbic Acid) .... Take Daily 5)  Cholest Off 450 Mg Tabs (Plant Sterols and Stanols) .... 2 Tablets Two Times A Day  Allergies (verified): No Known Drug Allergies  Past History:  Past medical, surgical, family and social histories (including risk factors) reviewed, and no changes noted (except as noted below).  Past Medical History: Reviewed history from 08/27/2008 and no changes required. HTN Hyperlipidemia  Past Surgical History: Reviewed history from 09/17/2006 and no changes required. Tendon repair in left arm  Family History: Reviewed history from 08/19/2009 and no changes required. Family History Breast cancer 1st degree relative    Father-Lymphoma no family hx of colon cancer  Social History: Reviewed history from 08/19/2009 and no changes required. Occupation: [employer] Divorced Never Smoked Alcohol use-yes Drug use-no Regular exercise-yes  Review of Systems      See HPI  Physical Exam  General:  well-nourished,in no acute distress; alert,appropriate and cooperative throughout examination Neck:  No deformities, masses, or tenderness noted. Lungs:  Normal respiratory effort, chest expands symmetrically. Lungs are clear to auscultation, no crackles or wheezes. Heart:  Normal rate and regular rhythm. S1 and S2 normal without gallop, murmur, click, rub or other extra sounds. Pulses:  +2 carotid, radial, DP Extremities:  No clubbing, cyanosis, edema.    Impression & Recommendations:  Problem # 1:  ABNORMAL FINDINGS, ELEVATED BP W/O HTN (ICD-796.2) Assessment Improved BP excellent today.  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  given normal lung capacity but lower than predicted expiratory values will try albuterol inhaler.  if no improvement in sxs should call and we will do formal lung testing.  Pt expresses understanding and is in agreement w/ this plan.  Orders: Spirometry w/Graph (94010)  Complete Medication List: 1)  Zyrtec Allergy 10 Mg Tabs (Cetirizine hcl) .... Take one tablet daily 2)  Multivitamins Caps (Multiple vitamin) 3)  Fish Oil 1200 Mg Caps (Omega-3 fatty acids) .... Take daily 4)  Vitamin C 500 Mg Tabs (  Ascorbic acid) .... Take daily 5)  Cholest Off 450 Mg Tabs (Plant sterols and stanols) .... 2 tablets two times a day  Patient Instructions: 1)  This sounds consistent w/ exercise induced asthma 2)  Start the inhaler- 2 puffs every 4 hrs as needed for shortness of breath or wheezing.  Use before exercise. 3)  If no improvement of symptoms with the inhaler, please call! 4)  Call with any questions or  concerns 5)  Happy Holidays!   Orders Added: 1)  Est. Patient Level III [78295] 2)  Spirometry w/Graph [[id number]]

## 2010-03-06 NOTE — Assessment & Plan Note (Signed)
Summary: Pulmonary consultaton/ eval ex sob / ? hyperventilation   Visit Type:  Initial Consult Copy to:  Dr. Laurine Blazer Primary Provider/Referring Provider:  Dr. Laurine Blazer  CC:  Dyspnea.  History of Present Illness: 45 yowm never smoked with excellent aerobic activity until 2001 then stopped it but even then did not feel the same capacity as peer but was able to complete triathalons with no weather related symptoms  February 07, 2010  1st pulmonary office eval new onset ex symptoms July of 2011 but now held back by breathing proportionate to exertion with no variability and immediately better if stops ex but also always present somewhat a rest with sense he can't get a deep breath >    spirometry on  a typical day with symptoms was nl and no better on ventolin. assoc with tingling in fingers and toes.    Pt denies any significant sore throat, dysphagia, itching, sneezing,  nasal congestion or excess secretions,  fever, chills, sweats, unintended wt loss, pleuritic or exertional cp, hempoptysis, variability  in activity tolerance  orthopnea pnd or leg swelling Pt also denies any obvious fluctuation in symptoms with weather or environmental change or other alleviating or aggravating factors.       Current Medications (verified): 1)  Zyrtec Allergy 10 Mg  Tabs (Cetirizine Hcl) .... Take One Tablet Daily 2)  Multivitamins   Caps (Multiple Vitamin) .Marland Kitchen.. 1 Once Daily 3)  Fish Oil 1200 Mg Caps (Omega-3 Fatty Acids) .... Take Daily 4)  Vitamin C 500 Mg Tabs (Ascorbic Acid) .... Take Daily 5)  Cholest Off 450 Mg Tabs (Plant Sterols and Stanols) .... 2 Tablets Two Times A Day 6)  Ventolin Hfa 108 (90 Base) Mcg/act Aers (Albuterol Sulfate) .... 2 Puffs 20 Min Prior To Exercise As Needed  Allergies (verified): No Known Drug Allergies  Past History:  Past Medical History: HTN Hyperlipidemia Unexplained doe ? hyperventilation syndrome     - Baseline spirometry nl  12/16/2009     -  CPST ordered February 08, 2010 >>>  Family History: Breast CA- Mother Father-Lymphoma no family hx of colon cancer gallbladder mother Negative for respiratory diseases or atopy   Social History: Occupation: [employer] Divorced 1 child Never Smoked Alcohol use-yes, occ Drug use-no Regular exercise-yes  Review of Systems       The patient complains of shortness of breath with activity and nasal congestion/difficulty breathing through nose.  The patient denies shortness of breath at rest, productive cough, non-productive cough, coughing up blood, chest pain, irregular heartbeats, acid heartburn, indigestion, loss of appetite, weight change, abdominal pain, difficulty swallowing, sore throat, tooth/dental problems, headaches, sneezing, itching, ear ache, anxiety, depression, hand/feet swelling, joint stiffness or pain, rash, change in color of mucus, and fever.    Vital Signs:  Patient profile:   45 year old male Height:      69 inches Weight:      165.13 pounds O2 Sat:      98 % on Room air Temp:     97.8 degrees F oral Pulse rate:   59 / minute BP sitting:   142 / 84  (left arm)  Vitals Entered By: Vernie Murders (February 07, 2010 10:48 AM)  O2 Flow:  Room air  Physical Exam  Additional Exam:  amb tense wm nad  wt 165 February 07, 2010 HEENT: nl dentition, turbinates, and orophanx. Nl external ear canals without cough reflex NECK :  without JVD/Nodes/TM/ nl carotid upstrokes bilaterally  LUNGS: no acc muscle use, clear to A and P bilaterally without cough on insp or exp maneuvers CV:  RRR  no s3 or murmur or increase in P2, no edema  ABD:  soft and nontender with nl excursion in the supine position. No bruits or organomegaly, bowel sounds nl MS:  warm without deformities, calf tenderness, cyanosis or clubbing SKIN: warm and dry without lesions   NEURO:  alert, approp, no deficits     CXR  Procedure date:  02/07/2010  Findings:        Comparison: January 30, 2008     Findings: Cardiomediastinal silhouette appears grossly normal.  No acute pulmonary disease is noted.   IMPRESSION: No acute cardiopulmonary abnormality seen.  Impression & Recommendations:  Problem # 1:  DYSPNEA (ICD-786.09)     DDX of  difficult airways managment all start with A and  include Adherence, Ace Inhibitors, Acid Reflux, Active Sinus Disease, Alpha 1 Antitripsin deficiency, Anxiety masquerading as Airways dz,  ABPA,  allergy(esp in young), Aspiration (esp in elderly), Adverse effects of DPI,  Active smokers, plus one B  = Beta blocker use.Marland Kitchen    Anxiety/ hyperventilation the most likely dx here but need cpst p 2 weeks gerd rx to be sure  ? acid reflux:  a common cause of ex symptoms that mimicks asthma very closely in the absence of overt hb > diet / rx reviewed  ? Allergies very unlikely given pattern of onset with resumption of aerobic activity in the summery of 2011  Orders: Consultation Level V (09811)  Medications Added to Medication List This Visit: 1)  Multivitamins Caps (Multiple vitamin) .Marland Kitchen.. 1 once daily 2)  Ventolin Hfa 108 (90 Base) Mcg/act Aers (Albuterol sulfate) .... 2 puffs 20 min prior to exercise as needed 3)  Prilosec Otc 20 Mg Tbec (Omeprazole magnesium) .... Take  one 30-60 min before first meal of the day 4)  Pepcid 20 Mg Tabs (Famotidine) .... Take one by mouth at bedtime  Other Orders: T-2 View CXR (71020TC) Misc. Referral (Misc. Ref)  Patient Instructions: 1)  Prilosec 20 mg Take  one 30-60 min before first meal of the day  2)  Pepcid 20 mg one at bedtime x 2 weeks right up to the test day 3)  GERD (REFLUX)  is a common cause of respiratory symptoms. It commonly presents without heartburn and can be treated with medication, but also with lifestyle changes including avoidance of late meals, excessive alcohol, smoking cessation, and avoid fatty foods, chocolate, peppermint, colas, red wine, and acidic juices such as orange juice. NO MINT OR  MENTHOL PRODUCTS SO NO COUGH DROPS  4)  USE SUGARLESS CANDY INSTEAD (jolley ranchers)  5)  NO OIL BASED VITAMINS (so stopped fish oil) 6)  See Patient Care Coordinator before leaving for CPST in 2 weeks (not before 2 weeks)  I will call you as soon as the results are available 7)  In meantime continue exercise to point of short of breath never out of breath

## 2010-03-06 NOTE — Progress Notes (Signed)
Summary: returning call  Phone Note Call from Patient Call back at Work Phone [phone]   Caller: Patient Call For: wert Summary of Call: returning call leslie Initial call taken by: Lacinda Axon,  February 11, 2010 8:05 AM  Follow-up for Phone Call        [facility] Vernie Murders  February 11, 2010 8:50 AM   Additional Follow-up for Phone Call Additional follow up Details #1::        Spoke with pt and notified of results/recs per MW.  Pt verbalized understanding. Additional Follow-up by: Vernie Murders,  February 11, 2010 2:34 PM

## 2010-03-06 NOTE — Letter (Signed)
Summary: CPST- R/O Contraindications  [facility]  [address]   [place], [place] [address]   Phone: [phone]  Fax: [phone]    Patient's Name: [name] Date of Birth: Dec 04, 1965 MRN: [id number]  *********Rule out Contraindications**************** Absolute                                                                                                                           ___ Acute MI (3-5 Days)                                 ___ Unstable Angina                                          ___ Uncontrolled arrhythmias causing symptoms or hemodynamic compromise. ___ Syncope                                                     ___ Active endocarditis                                         ___ Acute Myocarditis/Pericarditis                        ___ Symptomatic severe aortic stenosis  ___ Acute Pulmonary embolus or pulmonary infarction                ___ Uncontrolled Heart Failure  ___ Thrombosis of lower extremitie ___ Suspected dissecting aneurysm ___ Uncontrolled Asthma                          ___ Pulmonary Edema                                        ___ RA desat @ rest<85%                                      ___ Repiratory Failure                                         ___ Acute noncardiopulmonary disorder that may affect exercise performance or be         aggravated by exercise (ie infection, renal failure,  thyrotoxicosis) .                               ___ Mental impairment leading to inabliity to cooperate   Relative ___ Left main coronary stenosis or its equivalent ___ Moderate stentoic valvular heart disease ___ Severe untreated arterial hypertension @ rest (<200 mmHg             systolic,>19mmHg Diastolic ___ Tachy/Brady Arrhythmias ___ High- degree artioventricular block ___ Hypertrophic cardiomyopathy ___ Significant pulmonary hypertension ___ Advanced or complicated pregnancy ___ Electrolyte abnormalities ___ Orthopedic impairment  that compromises exercise performance    [facility]

## 2010-03-06 NOTE — Letter (Signed)
Summary: [facility]  [address]   [place], [place] [address]   Phone: [phone]  Fax: [phone]     Patient's Name: Duane Kirk Date of Birth: 19-Oct-1965 MRN: [id number]  [facility]  Choose test method and choice  a)___Bike - recommended by ATS/ACCP. Do at [facility] at Dr. Gala Romney [facility]  b)___Treadmill - less preferred. Do at [facility] at Dr. Gala Romney lab or do at [facility] lab  Choose one or more indication for test  INDICATIONS FOR CARDIOPULMONARY EXERCISE TESTING Evaluation of exercise tolerance ______ Determination of functional impairment or capacity (peak V? O2) ______ Determination of exercise-limiting factors and pathophysiologic mechanisms  Evaluation of undiagnosed exercise intolerance _____ Assessing contribution of cardiac and pulmonary etiology in coexisting disease _____ Symptoms disproportionate to resting pulmonary and cardiac tests  _____Unexplained dyspnea when initial cardiopulmonary testing is nondiagnostic  Evaluation of patients with cardiovascular disease _____ Functional evaluation and prognosis in patients with heart failure _____ Selection for cardiac transplantation _____ Exercise prescription and monitoring response to exercise training for cardiac rehabilitation (special circumstances; i.e., pacemakers)  Evaluation of patients with respiratory disease _____ Functional impairment assessment (see specific clinical applications)  _____Chronic obstructive pulmonary disease Establishing exercise limitation(s) and assessing other potential contributing factors, especially occult heart disease (ischemia) ______Determination of magnitude of hypoxemia and for O2 prescription When objective determination of therapeutic intervention is necessary and not adequately addressed by standard pulmonary function testing  _____ Interstitial lung diseases _____Detection of early (occult) gas exchange abnormalities _____Overall  assessment/monitoring of pulmonary gas exchange _____Determination of magnitude of hypoxemia and for O2 prescription _____Determination of potential exercise-limiting factors _____Documentation of therapeutic response to potentially toxic therapy  ____ Pulmonary vascular disease (careful risk-benefit analysis required)  ____ Cystic fibrosis  ____ Exercise-induced bronchospasm  Specific clinical applications ____  Preoperative evaluation _____Lung resectional surgery _____Elderly patients undergoing major abdominal surgery _____Lung volume resectional surgery for emphysema (currently investigational)  ____ Exercise evaluation and prescription for pulmonary rehabilitation  ____ Evaluation for impairment-disability  ____ Evaluation for lung, heart-lung transplantation ____ Definition of abbreviation: V? O2______ -oxygen consumption.    [facility]    [facility]

## 2010-03-12 ENCOUNTER — Telehealth (INDEPENDENT_AMBULATORY_CARE_PROVIDER_SITE_OTHER): Payer: Self-pay | Admitting: *Deleted

## 2010-03-12 ENCOUNTER — Encounter: Payer: Self-pay | Admitting: Internal Medicine

## 2010-03-20 NOTE — Progress Notes (Signed)
Summary: patient returning a call  Phone Note Call from Patient Call back at Work Phone [phone]   Caller: Patient Call For: wert Summary of Call: patient phoned stated he was returning a call to [facility] she left a message at his home number yesterday. I lost the call before I could get a day time number for the patient. Initial call taken by: Vedia Coffer,  March 12, 2010 1:33 PM  Follow-up for Phone Call        Spoke with pt regarding the Nuc Stress test results-he is aware of results but needs to know when MW would like to see him back. I have faxed the results to Dr. Beverely Low as requested.Reynaldo Minium CMA  March 12, 2010 2:46 PM  I'd be happy to see him back at his convenience to go over the study in detail but no further w/u is needed Follow-up by: Nyoka Cowden MD,  March 12, 2010 5:18 PM  Additional Follow-up for Phone Call Additional follow up Details #1::        Pt aware of recs from MW.Katie [facility] CMA  March 12, 2010 5:23 PM

## 2010-03-20 NOTE — Letter (Signed)
Summary: [facility]  [address]   [place], [place] [address]   Phone: [phone]  Fax: [phone]    03/12/2010  Andria Meuse [address] [place], [place]  [address]  Dear Mr. Alvan Dame,   Wer have tried to reach you by telephone regarding recent test results, and were unable to speak with you.  Please contact our office to obtain these results at [phone]. Thank You.        Sincerely,   [facility]

## 2010-03-26 ENCOUNTER — Ambulatory Visit (INDEPENDENT_AMBULATORY_CARE_PROVIDER_SITE_OTHER): Payer: Self-pay | Admitting: Family Medicine

## 2010-03-26 ENCOUNTER — Encounter: Payer: Self-pay | Admitting: Family Medicine

## 2010-03-26 DIAGNOSIS — R030 Elevated blood-pressure reading, without diagnosis of hypertension: Secondary | ICD-10-CM

## 2010-04-01 NOTE — Assessment & Plan Note (Signed)
Summary: bp is high--ph   Vital Signs:  Patient profile:   45 year old male Weight:      164 pounds BMI:     24.31 Pulse rate:   68 / minute BP sitting:   122 / 76  (left arm)  Vitals Entered By: Doristine Devoid CMA (March 26, 2010 1:34 PM) CC: f/u on bp    History of Present Illness: 45 yo man here today to f/u on BP.  no CP, SOB, HAs, visual changes.  BP was at high end of exercise response during stress test.  test indicated that he has no heart or lung damage and his SOB is due to irregular breathing during exercise.  Current Medications (verified): 1)  Zyrtec Allergy 10 Mg  Tabs (Cetirizine Hcl) .... Take One Tablet Daily 2)  Multivitamins   Caps (Multiple Vitamin) .Marland Kitchen.. 1 Once Daily 3)  Vitamin C 500 Mg Tabs (Ascorbic Acid) .... Take Daily 4)  Cholest Off 450 Mg Tabs (Plant Sterols and Stanols) .... 2 Tablets Two Times A Day 5)  Prilosec Otc 20 Mg Tbec (Omeprazole Magnesium) .... Take  One 30-60 Min Before First Meal of The Day 6)  Pepcid 20 Mg Tabs (Famotidine) .... Take One By Mouth At Bedtime  Allergies (verified): No Known Drug Allergies  Review of Systems      See HPI  Physical Exam  General:  well-nourished,in no acute distress; alert,appropriate and cooperative throughout examination Lungs:  Normal respiratory effort, chest expands symmetrically. Lungs are clear to auscultation, no crackles or wheezes. Heart:  Normal rate and regular rhythm. S1 and S2 normal without gallop, murmur, click, rub or other extra sounds.   Impression & Recommendations:  Problem # 1:  ABNORMAL FINDINGS, ELEVATED BP W/O HTN (ICD-796.2) Assessment Unchanged pt's BP normal today.  reviewed results of pulm testing.  no evidence of heart or lung dz.  will attempt to find someone for pt who can work on reteaching breathing.  Pt expresses understanding and is in agreement w/ this plan.  Complete Medication List: 1)  Zyrtec Allergy 10 Mg Tabs (Cetirizine hcl) .... Take one tablet daily 2)   Multivitamins Caps (Multiple vitamin) .Marland Kitchen.. 1 once daily 3)  Vitamin C 500 Mg Tabs (Ascorbic acid) .... Take daily 4)  Cholest Off 450 Mg Tabs (Plant sterols and stanols) .... 2 tablets two times a day 5)  Prilosec Otc 20 Mg Tbec (Omeprazole magnesium) .... Take  one 30-60 min before first meal of the day 6)  Pepcid 20 Mg Tabs (Famotidine) .... Take one by mouth at bedtime  Patient Instructions: 1)  Follow up in July for your complete physical- do not eat before this appt 2)  I'll ask around about the breathing retraining 3)  Call with any questions or concerns 4)  The good news is that your heart and lungs are fine!!!  YAY! 5)  Good luck with your race!!   Orders Added: 1)  Est. Patient Level III [29562]

## 2010-06-12 ENCOUNTER — Encounter: Payer: Self-pay | Admitting: Family Medicine

## 2010-06-20 NOTE — Op Note (Signed)
NAME:  Duane Kirk, Duane Kirk              ACCOUNT NO.:  [id number]   MEDICAL RECORD NO.:  [id number]          PATIENT TYPE:  AMB   LOCATION:  [facility]SC                          FACILITY:  [facility]   PHYSICIAN:  Robert A. Thurston Hole, M.D. DATE OF BIRTH:  Dec 02, 1965   DATE OF PROCEDURE:  05/24/2006  DATE OF DISCHARGE:                               OPERATIVE REPORT   PREOPERATIVE DIAGNOSIS:  Left distal biceps tendon rupture.   PROCEDURE:  Left distal biceps tendon rupture repair.   SURGEON:  Elana Alm. Thurston Hole, M.D.   ASSISTANT:  Julien Girt, P.A.   ANESTHESIA:  General.   OPERATIVE TIME:  One hour 10 minutes.   COMPLICATIONS:  None.   INDICATIONS FOR PROCEDURE:  Duane Kirk is a 45 year old gentleman who  was playing softball and sustained a hyperforced flexion injury to his  left elbow with a biceps tendon rupture documented by MRI.  He is now to  undergo biceps tendon rupture repair.   DESCRIPTION OF PROCEDURE:  Duane Kirk was brought to the operating room  on May 24, 2006, placed on the operative table in supine position.  After an adequate level of general anesthesia was obtained, his left  elbow was examined.  He had full range of motion, elbow was stable  ligamentous exam.  Left arm was then prepped using sterile DuraPrep and  draped using sterile technique.  He received Ancef 1 gram IV  preoperatively for prophylaxis.  The arm was exsanguinated and  tourniquet elevated to 275 mm.  Initially, through a 4 cm curvilinear  incision based in the antecubital crease, initial exposure was made.  The underlying subcutaneous tissues were incised along with skin  incision.  The antecubital vein carefully protected and retracted,  neurovascular structures retracted while the underlying biceps tendon  rupture was identified.  The ruptured end was carefully debrided back to  a healthy and smaller piece to be reinserted.  A #2 FiberWire with a  locking suture technique was then placed.   Careful dissection was then  carried out down to the bicipital tuberosity of the radius and this was  documented by fluoroscopy.  A drill bit from the cannulated 8.5 reamer  set was then drilled into the bicipital tuberosity and then over-drilled  with an 8.5 mm drill.  The 8.5 x 12 mm biceps tenodesis screw was then  placed and the biceps tendon was placed into the old drilled in the  bicipital tuberosity and the interference screw deployed, thus locking  the biceps tendon into its anatomic position with the elbow in  approximately 60-70 degrees of flexion.  After this was done, a knot was  tied over this with #2 fiber wires and then this was cut.  At this  point, it was found there was excellent fixation of the tendon back into  its position in the bicipital tuberosity and the hole that had been  drilled.  The wound was irrigated.  Tourniquet was released.  Small  venous bleeders were cauterized.  The wound was then closed with 2-0  Vicryl and 4-0 Monocryl.  Sterile dressings and a long-arm splint  was  applied.  The patient then awakened and taken to the recovery room in  stable condition.  Needle and sponge counts correct x2 at the end of the  case.   FOLLOW-UP CARE:  Duane Kirk will be followed as an outpatient on  Percocet and Robaxin.  See me back in the office in a week for wound  check and follow-up.      Robert A. Thurston Hole, M.D.  Electronically Signed     RAW/MEDQ  D:  05/24/2006  T:  05/24/2006  Job:  [id number]

## 2010-08-22 ENCOUNTER — Encounter: Payer: Self-pay | Admitting: Internal Medicine

## 2010-08-22 ENCOUNTER — Ambulatory Visit (INDEPENDENT_AMBULATORY_CARE_PROVIDER_SITE_OTHER): Payer: Federal, State, Local not specified - PPO | Admitting: Internal Medicine

## 2010-08-22 VITALS — BP 138/90 | HR 56 | Temp 98.3°F | Wt 158.0 lb

## 2010-08-22 DIAGNOSIS — H9209 Otalgia, unspecified ear: Secondary | ICD-10-CM

## 2010-08-22 DIAGNOSIS — J029 Acute pharyngitis, unspecified: Secondary | ICD-10-CM

## 2010-08-22 MED ORDER — PREDNISONE 20 MG PO TABS: 20.0000 mg | ORAL_TABLET | Freq: Two times a day (BID) | ORAL | Status: AC

## 2010-08-22 MED ORDER — TRAMADOL HCL 50 MG PO TABS: 50.0000 mg | ORAL_TABLET | Freq: Four times a day (QID) | ORAL | Status: AC | PRN

## 2010-08-22 MED ORDER — CEFUROXIME AXETIL 500 MG PO TABS: 500.0000 mg | ORAL_TABLET | Freq: Two times a day (BID) | ORAL | Status: AC

## 2010-08-22 NOTE — Progress Notes (Signed)
  Subjective:    Patient ID: Duane Kirk, male    DOB: 02/21/1965, 45 y.o.   MRN: [id number]  HPI EAR PAIN: Location: right    Onset: 7/12 as pressure in ear after tubing in river Symptoms  Sensation of fullness: yes Ear discharge: no URI symptoms: no , except sore throat and pain in the right submandibular area.  Fever: no    Tinnitus: no Dizziness: no Hearing loss: no Headache: no Toothache: no Treatment : Amoxicillin  500 mg 2 pills twice a day for an urgent care as a 7/15  Red Flags Recent trauma: no PMH recurrent OM: no  PMH prior ear surgery: no  Tubes currently: Neither  Recent antibiotic usage (last 30 days):  no Diabetes or Immunosuppresion:  no  He does train for triathlons; he uses plugs in his ears when he swims      Review of Systems     Objective:   Physical Exam General appearance is thin but  of good health and nourishment; no acute distress or increased work of breathing is present.  No  lymphadenopathy about the head, neck, or axilla noted.   Eyes: No conjunctival inflammation or lid edema is present. There is no scleral icterus.  Ears:  External ear exam shows no significant lesions or deformities.  Otoscopic examination reveals clear canals, tympanic membranes are intact bilaterally without bulging, retraction,  or discharge. The right tympanic membranes dull pink with decreased light reflex. Nose:  External nasal examination shows no deformity or inflammation. Nasal mucosa are pink and moist without lesions or exudates. No septal dislocation or dislocation.No obstruction to airflow.   Oral exam: Dental hygiene is good; lips and gums are healthy appearing.There is  oropharyngeal erythema w/o  exudate  . The erythema is most notable at the right posterior pharynx. The right tonsil appears to be enlarged without exudate. Neck:  No deformities, thyromegaly, masses, or tenderness noted.   Supple with full range of motion without pain. Clinically there is no  parapharyngeal abscess on the right.  Heart:   Slow rate and regular rhythm. S1 and S2 normal without gallop, murmur, click, rub or other extra sounds.   Lungs:Chest clear to auscultation; no wheezes, rhonchi,rales ,or rubs present.No increased work of breathing.    Extremities:  No cyanosis, edema, or clubbing  noted    Skin: Warm & dry w/o jaundice or tenting.          Assessment & Plan:  #1 ear pain, most likely referred from the right tonsillar area.  #2 pharyngitis without exudate or clinical parapharyngeal abscess  Plan: See orders. The antibiotic spectrum will be increased. A short course of steroids were  recommended to decrease swelling in this area while antibiotics

## 2010-08-22 NOTE — Patient Instructions (Signed)
To ER if pain persists or is associaled with Warning Signs as discussed. Plain Mucinex for thick secretions ;force NON dairy fluids for next 48 hrs. Use a Neti pot daily as needed for sinus congestion

## 2010-09-02 ENCOUNTER — Encounter: Payer: Self-pay | Admitting: Family Medicine

## 2010-09-02 ENCOUNTER — Ambulatory Visit (INDEPENDENT_AMBULATORY_CARE_PROVIDER_SITE_OTHER): Payer: Federal, State, Local not specified - PPO | Admitting: Family Medicine

## 2010-09-02 DIAGNOSIS — N529 Male erectile dysfunction, unspecified: Secondary | ICD-10-CM

## 2010-09-02 NOTE — Progress Notes (Signed)
  Subjective:    Patient ID: Koston Hennes, male    DOB: 11/21/1965, 45 y.o.   MRN: [id number]  HPI ED- is having sxs persisting past '1st time jitters'.  Has been doing some internet research- risk factors include HTN, cholesterol, frequent bike riding.  Still having '3/4' nocturnal erections.     Review of Systems For ROS see HPI     Objective:   Physical Exam  Vitals reviewed. Constitutional: He appears well-developed and well-nourished. No distress (but nervous about discussion).  Psychiatric: He has a normal mood and affect. His behavior is normal. Judgment and thought content normal.          Assessment & Plan:   No problem-specific assessment & plan notes found for this encounter.

## 2010-09-02 NOTE — Assessment & Plan Note (Signed)
Will start daily Cialis 5mg  and see if pt's sxs improve.  If no improvement with meds will refer to urology for complete evaluation.  Pt expressed understanding and is in agreement w/ plan.

## 2010-09-02 NOTE — Patient Instructions (Signed)
Schedule your complete physical at your convenience- do not eat before this appt Take 1 tab daily to see if symptoms improve Call with any questions or concerns Hang in there- RELAX!

## 2010-09-30 ENCOUNTER — Other Ambulatory Visit: Payer: Self-pay | Admitting: Family Medicine

## 2010-10-01 MED ORDER — TADALAFIL 5 MG PO TABS: 5.0000 mg | ORAL_TABLET | Freq: Every day | ORAL | Status: DC

## 2010-10-01 NOTE — Telephone Encounter (Signed)
Pt given Cialis 5mg  ok to send in Rx to pharmacy.Please advise

## 2010-10-01 NOTE — Telephone Encounter (Signed)
Pt aware Rx sent to Pharmacy

## 2010-10-01 NOTE — Telephone Encounter (Signed)
Ok to send #30, 6 refills

## 2010-10-14 ENCOUNTER — Encounter: Payer: Federal, State, Local not specified - PPO | Admitting: Family Medicine

## 2010-11-04 ENCOUNTER — Ambulatory Visit (INDEPENDENT_AMBULATORY_CARE_PROVIDER_SITE_OTHER): Payer: Federal, State, Local not specified - PPO | Admitting: Family Medicine

## 2010-11-04 ENCOUNTER — Encounter: Payer: Self-pay | Admitting: Family Medicine

## 2010-11-04 DIAGNOSIS — E785 Hyperlipidemia, unspecified: Secondary | ICD-10-CM

## 2010-11-04 DIAGNOSIS — R030 Elevated blood-pressure reading, without diagnosis of hypertension: Secondary | ICD-10-CM

## 2010-11-04 DIAGNOSIS — Z0000 Encounter for general adult medical examination without abnormal findings: Secondary | ICD-10-CM

## 2010-11-04 LAB — BASIC METABOLIC PANEL
CO2: 33 mEq/L — ABNORMAL HIGH (ref 19–32)
Chloride: 99 mEq/L (ref 96–112)
Sodium: 139 mEq/L (ref 135–145)

## 2010-11-04 LAB — LIPID PANEL
Total CHOL/HDL Ratio: 3
Triglycerides: 51 mg/dL (ref 0.0–149.0)
VLDL: 10.2 mg/dL (ref 0.0–40.0)

## 2010-11-04 LAB — PSA: PSA: 0.72 ng/mL (ref 0.10–4.00)

## 2010-11-04 LAB — CBC WITH DIFFERENTIAL/PLATELET
Basophils Absolute: 0 10*3/uL (ref 0.0–0.1)
Eosinophils Absolute: 0.1 10*3/uL (ref 0.0–0.7)
HCT: 51.3 % (ref 39.0–52.0)
Lymphs Abs: 1.1 10*3/uL (ref 0.7–4.0)
MCV: 92.9 fl (ref 78.0–100.0)
Monocytes Absolute: 0.4 10*3/uL (ref 0.1–1.0)
Monocytes Relative: 8.9 % (ref 3.0–12.0)
Platelets: 264 10*3/uL (ref 150.0–400.0)
RDW: 14.1 % (ref 11.5–14.6)

## 2010-11-04 LAB — HEPATIC FUNCTION PANEL
ALT: 34 U/L (ref 0–53)
Alkaline Phosphatase: 83 U/L (ref 39–117)
Bilirubin, Direct: 0.1 mg/dL (ref 0.0–0.3)
Total Bilirubin: 0.8 mg/dL (ref 0.3–1.2)
Total Protein: 7.7 g/dL (ref 6.0–8.3)

## 2010-11-04 NOTE — Patient Instructions (Signed)
Schedule an appt in 1-2 months to recheck BP We'll notify you of your lab results Keep up the good work!  Your exam looks great! Call with any questions or concerns Congrats on the engagement!

## 2010-11-04 NOTE — Assessment & Plan Note (Signed)
BP is elevated today.  Pt asymptomatic.  Pt to return in 4-8 weeks to recheck.  Reviewed supportive care and red flags that should prompt return.  Pt expressed understanding and is in agreement w/ plan.

## 2010-11-04 NOTE — Progress Notes (Signed)
  Subjective:    Patient ID: Duane Kirk, male    DOB: September 08, 1965, 45 y.o.   MRN: [id number]  HPI CPE- no concerns today.    Elevated BP- pt denies anxiety, asymptomatic.  No chest pain, SOB, HAs, visual changes, edema.   Review of Systems Patient reports no vision/hearing changes, anorexia, fever ,adenopathy, persistant/recurrent hoarseness, swallowing issues, chest pain, palpitations, edema, persistant/recurrent cough, hemoptysis, dyspnea (rest,exertional, paroxysmal nocturnal), gastrointestinal  bleeding (melena, rectal bleeding), abdominal pain, excessive heart burn, GU symptoms (dysuria, hematuria, voiding/incontinence issues) syncope, focal weakness, memory loss, numbness & tingling, skin/hair/nail changes, depression, anxiety, abnormal bruising/bleeding, musculoskeletal symptoms/signs.     Objective:   Physical Exam BP 150/100  Ht 5\' 9"  (1.753 m)  Wt 167 lb (75.751 kg)  BMI 24.66 kg/m2  General Appearance:    Alert, cooperative, no distress, appears stated age  Head:    Normocephalic, without obvious abnormality, atraumatic  Eyes:    PERRL, conjunctiva/corneas clear, EOM's intact, fundi    benign, both eyes       Ears:    Normal TM's and external ear canals, both ears  Nose:   Nares normal, septum midline, mucosa normal, no drainage   or sinus tenderness  Throat:   Lips, mucosa, and tongue normal; teeth and gums normal  Neck:   Supple, symmetrical, trachea midline, no adenopathy;       thyroid:  No enlargement/tenderness/nodules  Back:     Symmetric, no curvature, ROM normal, no CVA tenderness  Lungs:     Clear to auscultation bilaterally, respirations unlabored  Chest wall:    No tenderness or deformity  Heart:    Regular rate and rhythm, S1 and S2 normal, no murmur, rub   or gallop  Abdomen:     Soft, non-tender, bowel sounds active all four quadrants,    no masses, no organomegaly  Genitalia:    Normal male without lesion, masses,discharge or tenderness  Rectal:     Normal tone, normal prostate, no masses or tenderness  Extremities:   Extremities normal, atraumatic, no cyanosis or edema  Pulses:   2+ and symmetric all extremities  Skin:   Skin color, texture, turgor normal, no rashes or lesions  Lymph nodes:   Cervical, supraclavicular, and axillary nodes normal  Neurologic:   CNII-XII intact. Normal strength, sensation and reflexes      throughout          Assessment & Plan:

## 2010-11-04 NOTE — Assessment & Plan Note (Signed)
Check labs.  Adjust meds prn  

## 2010-11-04 NOTE — Assessment & Plan Note (Signed)
Pt's PE WNL.  Check labs.  EKG done- see document for interpretation.  Anticipatory guidance provided.  

## 2010-11-06 ENCOUNTER — Telehealth: Payer: Self-pay

## 2010-11-06 NOTE — Telephone Encounter (Signed)
Message copied by Beverely Low on Thu Nov 06, 2010  9:13 AM ------      Message from: Sheliah Hatch      Created: Wed Nov 05, 2010  9:29 AM       LDL has increased from 97 --> 135.  Was previously doing well on red yeast rice and diet/exercise.  Will need to repeat in 6 months to make sure it's not climbing.            Hgb is slightly high- may have been due to dehydration.  Will recheck at future visits            Remainder of labs look good!

## 2010-11-06 NOTE — Telephone Encounter (Signed)
Labs mailed

## 2010-11-06 NOTE — Progress Notes (Signed)
Quick Note:  Left detailed message to notify pt of results. Labs mailed ______

## 2010-12-18 ENCOUNTER — Telehealth: Payer: Self-pay | Admitting: *Deleted

## 2010-12-18 NOTE — Telephone Encounter (Signed)
Pt called to ask if he is due for a blood draw for his office visit on 12-19-10 (tommorow) contacted lab tech and was advised that he is NOT on the schedule for the lab and that he has had a cholesterol draw in the past 6 months and sometimes insurance will not pay for another draw. I offered to clarify with MD tabori in am if he needed to be fasting and that I could call him to let him know. I also advised that he is not on the schedule for labs but if she wants labs drawn he could schedule an appt soon after to come in on another day per orders/MD/insurance permitting. Pt understood and will just discuss with MD at 1:00 appt tommorow during visit

## 2010-12-19 ENCOUNTER — Ambulatory Visit (INDEPENDENT_AMBULATORY_CARE_PROVIDER_SITE_OTHER): Payer: Federal, State, Local not specified - PPO | Admitting: Family Medicine

## 2010-12-19 ENCOUNTER — Encounter: Payer: Self-pay | Admitting: Family Medicine

## 2010-12-19 DIAGNOSIS — R030 Elevated blood-pressure reading, without diagnosis of hypertension: Secondary | ICD-10-CM

## 2010-12-19 DIAGNOSIS — E785 Hyperlipidemia, unspecified: Secondary | ICD-10-CM

## 2010-12-19 NOTE — Assessment & Plan Note (Signed)
BP much better today- this is pt's norm.  Asymptomatic.  No need to start meds.

## 2010-12-19 NOTE — Progress Notes (Signed)
  Subjective:    Patient ID: Duane Kirk, male    DOB: 1966/01/25, 45 y.o.   MRN: [id number]  HPI Elevated BP- was high at last visit, normal today.  No CP, SOB, HAs, visual changes, edema.  Hyperlipidemia- chronic problem for pt, was previously taking Cholestoff but had stopped.  Reports he has restarted.  Very anxious about his LDL   Review of Systems For ROS see HPI     Objective:   Physical Exam  Vitals reviewed. Constitutional: He is oriented to person, place, and time. He appears well-developed and well-nourished. No distress.  HENT:  Head: Normocephalic and atraumatic.  Eyes: Conjunctivae and EOM are normal. Pupils are equal, round, and reactive to light.  Neck: Normal range of motion. Neck supple. No thyromegaly present.  Cardiovascular: Normal rate, regular rhythm, normal heart sounds and intact distal pulses.   No murmur heard. Pulmonary/Chest: Effort normal and breath sounds normal. No respiratory distress.  Abdominal: Soft. Bowel sounds are normal. He exhibits no distension.  Musculoskeletal: He exhibits no edema.  Lymphadenopathy:    He has no cervical adenopathy.  Neurological: He is alert and oriented to person, place, and time. No cranial nerve deficit.  Skin: Skin is warm and dry.  Psychiatric: He has a normal mood and affect. His behavior is normal.          Assessment & Plan:

## 2010-12-19 NOTE — Assessment & Plan Note (Signed)
Discussed w/ pt that I understand his concerns about his LDL but that it is too early to recheck.  Encouraged him to continue his healthy choices and we will repeat it in 5 months.  Encouraged him not to stress about this.  Will follow.

## 2010-12-19 NOTE — Patient Instructions (Signed)
Follow up in 5 months to recheck cholesterol You look great!  Keep up the good work! Your blood pressure and cholesterol are fine!  Don't stress!! HAPPY HOLIDAYS AND ENJOY THE [place]!!!

## 2011-04-29 ENCOUNTER — Ambulatory Visit (INDEPENDENT_AMBULATORY_CARE_PROVIDER_SITE_OTHER): Payer: Federal, State, Local not specified - PPO | Admitting: Physician Assistant

## 2011-04-29 VITALS — BP 152/94 | HR 71 | Temp 97.8°F | Resp 16 | Ht 69.0 in | Wt 170.0 lb

## 2011-04-29 DIAGNOSIS — J029 Acute pharyngitis, unspecified: Secondary | ICD-10-CM

## 2011-04-29 DIAGNOSIS — H6690 Otitis media, unspecified, unspecified ear: Secondary | ICD-10-CM

## 2011-04-29 DIAGNOSIS — H9209 Otalgia, unspecified ear: Secondary | ICD-10-CM

## 2011-04-29 LAB — POCT RAPID STREP A (OFFICE): Rapid Strep A Screen: NEGATIVE

## 2011-04-29 MED ORDER — ANTIPYRINE-BENZOCAINE 5.4-1.4 % OT SOLN: 3.0000 [drp] | Freq: Four times a day (QID) | OTIC | Status: AC | PRN

## 2011-04-29 MED ORDER — CEFDINIR 300 MG PO CAPS: 300.0000 mg | ORAL_CAPSULE | Freq: Two times a day (BID) | ORAL | Status: AC

## 2011-04-29 NOTE — Progress Notes (Signed)
Patient ID: Duane Kirk MRN: [id number], DOB: March 12, 1965, 46 y.o. Date of Encounter: 04/29/2011, 7:37 PM  Primary Physician: Neena Rhymes, MD, MD  Chief Complaint:  Chief Complaint  Patient presents with  . Sore Throat    x 9 days  . Otalgia    L ear    HPI: 46 y.o. year old male presents with a 9 day history of sore throat, and a 2 day history of left otalgia. Afebrile. No chills. Mild nasal congestion thick and green/yellow. Mild cough is nonproductive and not associated with time of day. Left ear feel full and painful, leading to sensation of muffled hearing. Has tried OTC cold preps without success. No GI complaints. Appetite slightly decreased. History of multiple ear infections.   No sick contacts, recent antibiotics, or recent travels.   No leg trauma, sedentary periods, h/o cancer, or tobacco use.  Past Medical History  Diagnosis Date  . Hypertension   . Hyperlipidemia   . DOE (dyspnea on exertion)     ?hyperventilation syndrome     Home Meds: Prior to Admission medications   Medication Sig Start Date End Date Taking? Authorizing Provider  Ascorbic Acid (VITAMIN C CR) 500 MG TBCR Take by mouth daily.     Yes Historical Provider, MD  cetirizine (ZYRTEC) 10 MG tablet Take 10 mg by mouth daily.     Yes Historical Provider, MD  fish oil-omega-3 fatty acids 1000 MG capsule Take 2 g by mouth daily.     Yes Historical Provider, MD  Multiple Vitamin (MULTIVITAMIN) capsule Take 1 capsule by mouth daily.     Yes Historical Provider, MD  CIALIS 5 MG tablet  10/01/10   Historical Provider, MD  tadalafil (CIALIS) 5 MG tablet Take 1 tablet (5 mg total) by mouth daily. 10/01/10 10/31/10  Sheliah Hatch, MD  traMADol (ULTRAM) 50 MG tablet Take 1 tablet (50 mg total) by mouth every 6 (six) hours as needed for pain. 08/22/10 08/22/11  Pecola Lawless, MD    Allergies: No Known Allergies  History   Social History  . Marital Status: Divorced    Spouse Name: N/A    Number  of Children: N/A  . Years of Education: N/A   Occupational History  . Not on file.   Social History Main Topics  . Smoking status: Never Smoker   . Smokeless tobacco: Not on file  . Alcohol Use: Yes     occ.  . Drug Use: No  . Sexually Active:    Other Topics Concern  . Not on file   Social History Narrative   1 child.     Review of Systems: Constitutional: negative for chills, fever, night sweats or weight changes Cardiovascular: negative for chest pain or palpitations Respiratory: negative for hemoptysis, wheezing, or shortness of breath Abdominal: negative for abdominal pain, nausea, vomiting or diarrhea Dermatological: negative for rash Neurologic: negative for headache   Physical Exam: Blood pressure 152/94, pulse 71, temperature 97.8 F (36.6 C), temperature source Oral, resp. rate 16, height 5\' 9"  (1.753 m), weight 170 lb (77.111 kg)., Body mass index is 25.10 kg/(m^2). General: Well developed, well nourished, in no acute distress. Head: Normocephalic, atraumatic, eyes without discharge, sclera non-icteric, nares are congested. Bilateral auditory canals clear. Left TM erythematous, dull, and bulging. Purulent effusion behind TM. Bilateral TM's are without perforation. Right TM is pearly grey with reflective cone of light visualized. No mastoid TTP bilaterally. No sinus TTP. Oral cavity moist, dentition normal. Posterior pharynx  with post nasal drip and mild erythema. No peritonsillar abscess or tonsillar exudate. Neck: Supple. No thyromegaly. Full ROM. No lymphadenopathy. Lungs: Clear bilaterally to auscultation without wheezes, rales, or rhonchi. Breathing is unlabored.  Heart: RRR with S1 S2. No murmurs, rubs, or gallops appreciated. Msk:  Strength and tone normal for age. Extremities: No clubbing or cyanosis. No edema. Neuro: Alert and oriented X 3. Moves all extremities spontaneously. CNII-XII grossly in tact. Psych:  Responds to questions appropriately with a  normal affect.   Labs: Results for orders placed in visit on 04/29/11  POCT RAPID STREP A (OFFICE)      Component Value Range   Rapid Strep A Screen Negative  Negative    TC pending  ASSESSMENT AND PLAN:  46 y.o. year old male with AOM left ear and otalgia. -Omnicef 300 mg 1 po bid #20 no RF  -A/B Otic #1 3 gtts left ear qid prn no RF -Mucinex -Tylenol/Motrin prn -Rest/fluids -RTC precautions -RTC 3 days if no improvement  Elinor Dodge, PA-C 04/29/2011 7:37 PM

## 2011-05-02 LAB — CULTURE, GROUP A STREP: Organism ID, Bacteria: NORMAL

## 2011-05-22 ENCOUNTER — Encounter: Payer: Self-pay | Admitting: Family Medicine

## 2011-05-22 ENCOUNTER — Ambulatory Visit (INDEPENDENT_AMBULATORY_CARE_PROVIDER_SITE_OTHER): Payer: Federal, State, Local not specified - PPO | Admitting: Family Medicine

## 2011-05-22 VITALS — BP 130/75 | HR 60 | Temp 97.5°F | Ht 68.25 in | Wt 171.2 lb

## 2011-05-22 DIAGNOSIS — R030 Elevated blood-pressure reading, without diagnosis of hypertension: Secondary | ICD-10-CM

## 2011-05-22 DIAGNOSIS — E785 Hyperlipidemia, unspecified: Secondary | ICD-10-CM

## 2011-05-22 LAB — HEPATIC FUNCTION PANEL
AST: 28 U/L (ref 0–37)
Albumin: 4.5 g/dL (ref 3.5–5.2)
Alkaline Phosphatase: 79 U/L (ref 39–117)
Bilirubin, Direct: 0 mg/dL (ref 0.0–0.3)

## 2011-05-22 LAB — LIPID PANEL
HDL: 83.2 mg/dL (ref >39.00)
VLDL: 9.4 mg/dL (ref 0.0–40.0)

## 2011-05-22 LAB — LDL CHOLESTEROL, DIRECT: Direct LDL: 126.1 mg/dL

## 2011-05-22 NOTE — Assessment & Plan Note (Signed)
BP WNL today.  Will continue to follow at future visits.

## 2011-05-22 NOTE — Progress Notes (Signed)
  Subjective:    Patient ID: Duane Kirk, male    DOB: [birth year], 46 y.o.   MRN: [id number]  HPI BP- normal today, no concerns.  Hyperlipidemia- chronic problem, on Cholestoff.  Due for recheck.  Exercising regularly but less than previous due to wedding and recent move.   Review of Systems For ROS see HPI     Objective:   Physical Exam  Vitals reviewed. Constitutional: He is oriented to person, place, and time. He appears well-developed and well-nourished. No distress.  HENT:  Head: Normocephalic and atraumatic.  Eyes: Conjunctivae and EOM are normal. Pupils are equal, round, and reactive to light.  Neck: Normal range of motion. Neck supple. No thyromegaly present.  Cardiovascular: Normal rate, regular rhythm, normal heart sounds and intact distal pulses.   No murmur heard. Pulmonary/Chest: Effort normal and breath sounds normal. No respiratory distress.  Abdominal: Soft. Bowel sounds are normal. He exhibits no distension.  Musculoskeletal: He exhibits no edema.  Lymphadenopathy:    He has no cervical adenopathy.  Neurological: He is alert and oriented to person, place, and time. No cranial nerve deficit.  Skin: Skin is warm and dry.  Psychiatric: He has a normal mood and affect. His behavior is normal.          Assessment & Plan:

## 2011-05-22 NOTE — Patient Instructions (Signed)
Schedule your complete physical for after 11/16 [facility] notify you of your lab results and make any changes if needed Congrats on the wedding!

## 2011-05-22 NOTE — Assessment & Plan Note (Signed)
Chronic problem.  Pt trying to control this naturally.  Check labs.  Start statin prn.

## 2011-05-25 ENCOUNTER — Encounter: Payer: Self-pay | Admitting: *Deleted

## 2011-06-27 ENCOUNTER — Ambulatory Visit (INDEPENDENT_AMBULATORY_CARE_PROVIDER_SITE_OTHER): Payer: Federal, State, Local not specified - PPO | Admitting: Family Medicine

## 2011-06-27 VITALS — BP 133/84 | HR 57 | Temp 97.7°F | Resp 16 | Ht 67.5 in | Wt 169.0 lb

## 2011-06-27 DIAGNOSIS — J309 Allergic rhinitis, unspecified: Secondary | ICD-10-CM

## 2011-06-27 DIAGNOSIS — H6980 Other specified disorders of Eustachian tube, unspecified ear: Secondary | ICD-10-CM

## 2011-06-27 DIAGNOSIS — J302 Other seasonal allergic rhinitis: Secondary | ICD-10-CM

## 2011-06-27 DIAGNOSIS — J029 Acute pharyngitis, unspecified: Secondary | ICD-10-CM

## 2011-06-27 LAB — POCT CBC
HCT, POC: 48.7 % (ref 43.5–53.7)
Hemoglobin: 16.2 g/dL (ref 14.1–18.1)
Lymph, poc: 1.5 (ref 0.6–3.4)
MCH, POC: 30.8 pg (ref 27–31.2)
MCHC: 33.3 g/dL (ref 31.8–35.4)
MCV: 92.5 fL (ref 80–97)
POC MID %: 8.9 %M (ref 0–12)
RBC: 5.26 M/uL (ref 4.69–6.13)
WBC: 7.1 10*3/uL (ref 4.6–10.2)

## 2011-06-27 MED ORDER — PREDNISONE 20 MG PO TABS: 20.0000 mg | ORAL_TABLET | Freq: Every day | ORAL | Status: AC

## 2011-06-27 MED ORDER — FLUTICASONE PROPIONATE 50 MCG/ACT NA SUSP: 2.0000 | Freq: Every day | NASAL | Status: AC

## 2011-06-27 NOTE — Progress Notes (Signed)
  Subjective:    Patient ID: Jermario Kalmar, male    DOB: 1965/07/30, 46 y.o.   MRN: [id number]  HPI  Patient presents with 1 weeks history of PND, ST and otalgia. Significant nasal congestion  Taking advil to treat symptoms History of allergies  Review of Systems     Objective:   Physical Exam  Constitutional: He appears well-developed.  HENT:  Head: Normocephalic.       Erythema uvula Clear PND Serous effusion (R) ear  Neck: Neck supple.  Cardiovascular: Normal rate, regular rhythm and normal heart sounds.   Pulmonary/Chest: Effort normal and breath sounds normal.  Neurological: He is alert.  Skin: Skin is warm.      Results for orders placed in visit on 06/27/11  POCT CBC      Component Value Range   WBC 7.1  4.6 - 10.2 (K/uL)   Lymph, poc 1.5  0.6 - 3.4    POC LYMPH PERCENT 20.6  10 - 50 (%L)   MID (cbc) 0.6  0 - 0.9    POC MID % 8.9  0 - 12 (%M)   POC Granulocyte 5.0  2 - 6.9    Granulocyte percent 70.5  37 - 80 (%G)   RBC 5.26  4.69 - 6.13 (M/uL)   Hemoglobin 16.2  14.1 - 18.1 (g/dL)   HCT, POC 40.9  81.1 - 53.7 (%)   MCV 92.5  80 - 97 (fL)   MCH, POC 30.8  27 - 31.2 (pg)   MCHC 33.3  31.8 - 35.4 (g/dL)   RDW, POC 91.4     Platelet Count, POC 302.0  142 - 424 (K/uL)   MPV 8.6  0 - 99.8 (fL)       Assessment & Plan:   1. Pharyngitis  POCT CBC  2. Seasonal allergies  fluticasone (FLONASE) 50 MCG/ACT nasal spray  3. ETD (eustachian tube dysfunction)  predniSONE (DELTASONE) 20 MG tablet    Anticipatory guidance

## 2011-09-26 ENCOUNTER — Ambulatory Visit: Payer: Federal, State, Local not specified - PPO

## 2011-09-28 ENCOUNTER — Ambulatory Visit (INDEPENDENT_AMBULATORY_CARE_PROVIDER_SITE_OTHER): Payer: Federal, State, Local not specified - PPO | Admitting: Family Medicine

## 2011-09-28 ENCOUNTER — Encounter: Payer: Self-pay | Admitting: Family Medicine

## 2011-09-28 VITALS — BP 124/76 | HR 61 | Temp 98.1°F | Ht 68.25 in | Wt 175.6 lb

## 2011-09-28 DIAGNOSIS — R350 Frequency of micturition: Secondary | ICD-10-CM

## 2011-09-28 DIAGNOSIS — R319 Hematuria, unspecified: Secondary | ICD-10-CM

## 2011-09-28 LAB — POCT URINALYSIS DIPSTICK
Bilirubin, UA: NEGATIVE
Glucose, UA: NEGATIVE
Ketones, UA: NEGATIVE
Nitrite, UA: NEGATIVE
Spec Grav, UA: 1.02
pH, UA: 7

## 2011-09-28 MED ORDER — CIPROFLOXACIN HCL 500 MG PO TABS: 500.0000 mg | ORAL_TABLET | Freq: Two times a day (BID) | ORAL | Status: AC

## 2011-09-28 NOTE — Patient Instructions (Addendum)
We'll call you with your urine culture results Drink plenty of fluids Call if symptoms change or worsen Hang in there!!!

## 2011-09-28 NOTE — Assessment & Plan Note (Signed)
New.  Pt w/out STD exposure so will tx as UTI.  Will start cipro and await urine culture.  If no improvement in sxs or cx negative will refer to urology.  Pt expressed understanding and is in agreement w/ plan.

## 2011-09-28 NOTE — Progress Notes (Signed)
  Subjective:    Patient ID: Duane Kirk, male    DOB: 03/27/1965, 46 y.o.   MRN: [id number]  HPI ? UTI- 2 weeks ago had burning w/ ejaculation x3-4.  Also had weak urinary stream at that time.  Burning improved, flow improved.  Friday afternoon had strand of bloody mucous from urethra.  This occurred daily until yesterday afternoon.  + frequency, urgency.  No burning or pain w/ sitting of BMs.   Review of Systems For ROS see HPI     Objective:   Physical Exam  Vitals reviewed. Constitutional: He is oriented to person, place, and time. He appears well-developed and well-nourished. No distress.  Abdominal: Soft. Bowel sounds are normal. He exhibits no distension. There is no tenderness (no CVA or suprapubic tenderness). There is no rebound and no guarding.  Neurological: He is alert and oriented to person, place, and time.  Skin: Skin is warm and dry.          Assessment & Plan:

## 2011-09-29 LAB — CULTURE, URINE COMPREHENSIVE: Organism ID, Bacteria: NO GROWTH

## 2011-09-30 NOTE — Addendum Note (Signed)
Addended by: Derry Lory A on: 09/30/2011 10:51 AM   Modules accepted: Orders

## 2011-10-21 ENCOUNTER — Telehealth: Payer: Self-pay | Admitting: Family Medicine

## 2011-10-21 NOTE — Telephone Encounter (Signed)
LM ON TRAIGE LINE 201PM - questions regarding referral to Urology  Pt stated he was in for possible UTI was prescribed Antibiotics, has finished antibiotics and feels much better wants to know if he should keep his appt with Urology since he feels better Cb# [phone]

## 2011-10-21 NOTE — Telephone Encounter (Signed)
Noted waiver in pt chart signed to allow detailed messages to be left on voicemail, left detailed message about: results/instructions/prescribtion information. Advise if any further concerns or questions please call our office at [phone]. Advised that MD Beverely Low gave this nurse verbal instructions to advise pt that he DOES NEED to KEEP his urology apt, if he has any questions or concerns call our office

## 2011-12-21 ENCOUNTER — Encounter: Payer: Self-pay | Admitting: Family Medicine

## 2011-12-21 ENCOUNTER — Ambulatory Visit (INDEPENDENT_AMBULATORY_CARE_PROVIDER_SITE_OTHER): Payer: Federal, State, Local not specified - PPO | Admitting: Family Medicine

## 2011-12-21 VITALS — BP 140/96 | HR 81 | Temp 97.6°F | Ht 69.0 in | Wt 175.6 lb

## 2011-12-21 DIAGNOSIS — G4700 Insomnia, unspecified: Secondary | ICD-10-CM

## 2011-12-21 DIAGNOSIS — E785 Hyperlipidemia, unspecified: Secondary | ICD-10-CM

## 2011-12-21 DIAGNOSIS — Z0000 Encounter for general adult medical examination without abnormal findings: Secondary | ICD-10-CM

## 2011-12-21 DIAGNOSIS — I10 Essential (primary) hypertension: Secondary | ICD-10-CM

## 2011-12-21 LAB — TSH: TSH: 3.26 u[IU]/mL (ref 0.35–5.50)

## 2011-12-21 LAB — CBC WITH DIFFERENTIAL/PLATELET
Basophils Relative: 0.5 % (ref 0.0–3.0)
Eosinophils Relative: 2.5 % (ref 0.0–5.0)
Lymphocytes Relative: 16.4 % (ref 12.0–46.0)
MCV: 93.9 fl (ref 78.0–100.0)
Monocytes Absolute: 0.5 10*3/uL (ref 0.1–1.0)
Monocytes Relative: 9.4 % (ref 3.0–12.0)
Neutrophils Relative %: 71.2 % (ref 43.0–77.0)
Platelets: 273 10*3/uL (ref 150.0–400.0)
RBC: 5.03 Mil/uL (ref 4.22–5.81)
WBC: 5.4 10*3/uL (ref 4.5–10.5)

## 2011-12-21 LAB — BASIC METABOLIC PANEL
BUN: 16 mg/dL (ref 6–23)
CO2: 28 mEq/L (ref 19–32)
Chloride: 103 mEq/L (ref 96–112)
GFR: 107.41 mL/min (ref >60.00)
Glucose, Bld: 93 mg/dL (ref 70–99)
Potassium: 4.4 mEq/L (ref 3.5–5.1)
Sodium: 139 mEq/L (ref 135–145)

## 2011-12-21 LAB — HEPATIC FUNCTION PANEL
ALT: 25 U/L (ref 0–53)
AST: 35 U/L (ref 0–37)
Albumin: 4.5 g/dL (ref 3.5–5.2)
Alkaline Phosphatase: 85 U/L (ref 39–117)

## 2011-12-21 LAB — LIPID PANEL: HDL: 76 mg/dL (ref >39.00)

## 2011-12-21 LAB — LDL CHOLESTEROL, DIRECT: Direct LDL: 136.4 mg/dL

## 2011-12-21 MED ORDER — CLONAZEPAM 0.5 MG PO TABS: 0.5000 mg | ORAL_TABLET | Freq: Every evening | ORAL | Status: DC | PRN

## 2011-12-21 MED ORDER — NEBIVOLOL HCL 10 MG PO TABS: 10.0000 mg | ORAL_TABLET | Freq: Every day | ORAL | Status: DC

## 2011-12-21 NOTE — Assessment & Plan Note (Signed)
Pt's BP has been elevated previously and today's reading places him into the hypertensive category.  Previously on Lisinopril but this was stopped.  Has had issues w/ elevated K+ in the past.  Will start Bystolic 10mg - samples given- and monitor closely.  Reviewed supportive care and red flags that should prompt return.  Pt expressed understanding and is in agreement w/ plan.

## 2011-12-21 NOTE — Progress Notes (Signed)
  Subjective:    Patient ID: Duane Kirk, male    DOB: 02/08/1965, 46 y.o.   MRN: [id number]  HPI CPE-   HTN- pt previously on Lisinopril but we stopped this and BP has been fine w/out it.  At urology BP was 160/100.  Denies CP, SOB, HAs, visual changes, edema.  Insomnia- some RLS sxs but 'not real bad'.  Having trouble falling asleep and staying asleep.  Increased stress recently.  'i'm lucky to get 3-4 hrs/night'.  Took mother-in-law's xanax w/ some relief of sxs.  Chronic problem.  Bedtime is time when thoughts race.  Has tried OTC meds w/out relief- melatonin, tylenol PM.  Review of Systems Patient reports no vision/hearing changes, anorexia, fever ,adenopathy, persistant/recurrent hoarseness, swallowing issues, chest pain, palpitations, edema, persistant/recurrent cough, hemoptysis, dyspnea (rest,exertional, paroxysmal nocturnal), gastrointestinal  bleeding (melena, rectal bleeding), abdominal pain, excessive heart burn, GU symptoms (dysuria, hematuria, voiding/incontinence issues) syncope, focal weakness, memory loss, numbness & tingling, skin/hair/nail changes, depression, anxiety, abnormal bruising/bleeding, musculoskeletal symptoms/signs.     Objective:   Physical Exam BP 140/96  Pulse 81  Temp 97.6 F (36.4 C) (Oral)  Ht 5\' 9"  (1.753 m)  Wt 175 lb 9.6 oz (79.652 kg)  BMI 25.93 kg/m2  SpO2 91%  General Appearance:    Alert, cooperative, no distress, appears stated age  Head:    Normocephalic, without obvious abnormality, atraumatic  Eyes:    PERRL, conjunctiva/corneas clear, EOM's intact, fundi    benign, both eyes       Ears:    Normal TM's and external ear canals, both ears  Nose:   Nares normal, septum midline, mucosa normal, no drainage   or sinus tenderness  Throat:   Lips, mucosa, and tongue normal; teeth and gums normal  Neck:   Supple, symmetrical, trachea midline, no adenopathy;       thyroid:  No enlargement/tenderness/nodules  Back:     Symmetric, no curvature,  ROM normal, no CVA tenderness  Lungs:     Clear to auscultation bilaterally, respirations unlabored  Chest wall:    No tenderness or deformity  Heart:    Regular rate and rhythm, S1 and S2 normal, no murmur, rub   or gallop  Abdomen:     Soft, non-tender, bowel sounds active all four quadrants,    no masses, no organomegaly  Genitalia:    Deferred to recent urology appt  Rectal:    Extremities:   Extremities normal, atraumatic, no cyanosis or edema  Pulses:   2+ and symmetric all extremities  Skin:   Skin color, texture, turgor normal, no rashes or lesions  Lymph nodes:   Cervical, supraclavicular, and axillary nodes normal  Neurologic:   CNII-XII intact. Normal strength, sensation and reflexes      throughout          Assessment & Plan:

## 2011-12-21 NOTE — Assessment & Plan Note (Signed)
New.  Pt admits to increased stress recently but has a long hx of sleep issues.  Since he also has increased anxiety and some RLS sxs, will start Klonopin nightly and see if sxs improve.  Pt expressed understanding and is in agreement w/ plan.

## 2011-12-21 NOTE — Patient Instructions (Addendum)
Follow up in 1 month to recheck BP Start the Bystolic daily Use the Klonopin nightly for anxiety, sleep, and RLS Keep up the good work!  You look great! We'll notify you of your lab results Happy Thanksgiving!!!

## 2011-12-21 NOTE — Assessment & Plan Note (Signed)
Chronic problem.  Has taken red yeast rice x2 months.  Aware that he may need to start statin.  Will determine next steps based on lab results.

## 2011-12-21 NOTE — Assessment & Plan Note (Signed)
Pt's PE WNL w/ exception of elevated BP.  Check labs.  Anticipatory guidance provided.  

## 2012-01-25 ENCOUNTER — Encounter: Payer: Self-pay | Admitting: Lab

## 2012-01-28 ENCOUNTER — Ambulatory Visit (INDEPENDENT_AMBULATORY_CARE_PROVIDER_SITE_OTHER): Payer: Federal, State, Local not specified - PPO | Admitting: Family Medicine

## 2012-01-28 ENCOUNTER — Encounter: Payer: Self-pay | Admitting: Family Medicine

## 2012-01-28 VITALS — BP 118/80 | HR 42 | Temp 97.6°F | Ht 69.0 in | Wt 174.0 lb

## 2012-01-28 DIAGNOSIS — G4700 Insomnia, unspecified: Secondary | ICD-10-CM

## 2012-01-28 DIAGNOSIS — I10 Essential (primary) hypertension: Secondary | ICD-10-CM

## 2012-01-28 MED ORDER — NEBIVOLOL HCL 10 MG PO TABS: 10.0000 mg | ORAL_TABLET | Freq: Every day | ORAL | Status: DC

## 2012-01-28 MED ORDER — CLONAZEPAM 0.5 MG PO TABS: 0.5000 mg | ORAL_TABLET | Freq: Every evening | ORAL | Status: DC | PRN

## 2012-01-28 NOTE — Patient Instructions (Addendum)
Schedule a lab visit in 3 months to recheck cholesterol BP looks great!  No changes! Call with any questions or concerns Happy New Year!!!!

## 2012-01-28 NOTE — Progress Notes (Signed)
  Subjective:    Patient ID: Duane Kirk, male    DOB: [birth year], 46 y.o.   MRN: [id number]  HPI HTN- was started on Bystolic last visit.  BP is excellent today.  No CP, SOB, HAs, visual changes, swelling, dizziness.   Review of Systems For ROS see HPI     Objective:   Physical Exam  Constitutional: He is oriented to person, place, and time. He appears well-developed and well-nourished. No distress.  HENT:  Head: Normocephalic and atraumatic.  Eyes: Conjunctivae normal and EOM are normal. Pupils are equal, round, and reactive to light.  Neck: Normal range of motion. Neck supple. No thyromegaly present.  Cardiovascular: Normal rate, regular rhythm, normal heart sounds and intact distal pulses.   No murmur heard. Pulmonary/Chest: Effort normal and breath sounds normal. No respiratory distress.  Abdominal: Soft. Bowel sounds are normal. He exhibits no distension.  Musculoskeletal: He exhibits no edema.  Lymphadenopathy:    He has no cervical adenopathy.  Neurological: He is alert and oriented to person, place, and time. No cranial nerve deficit.  Skin: Skin is warm and dry.  Psychiatric: He has a normal mood and affect. His behavior is normal.          Assessment & Plan:

## 2012-01-28 NOTE — Assessment & Plan Note (Signed)
Much improved since starting Bystolic.  Continue current dose.  No changes.  No labs required.  Will continue to follow.

## 2012-01-28 NOTE — Assessment & Plan Note (Signed)
Refill on Klonopin given.  Pt signed controlled substance agreement and provided UDS

## 2012-02-24 ENCOUNTER — Other Ambulatory Visit: Payer: Self-pay | Admitting: *Deleted

## 2012-02-24 DIAGNOSIS — I10 Essential (primary) hypertension: Secondary | ICD-10-CM

## 2012-02-24 MED ORDER — NEBIVOLOL HCL 10 MG PO TABS: 10.0000 mg | ORAL_TABLET | Freq: Every day | ORAL | Status: DC

## 2012-02-24 NOTE — Progress Notes (Signed)
Call from pharmacy Rx not received from Dec 2013, Rx-resent

## 2012-03-30 ENCOUNTER — Encounter: Payer: Self-pay | Admitting: Family Medicine

## 2012-04-22 ENCOUNTER — Encounter: Payer: Self-pay | Admitting: Family Medicine

## 2012-04-22 ENCOUNTER — Ambulatory Visit (INDEPENDENT_AMBULATORY_CARE_PROVIDER_SITE_OTHER): Payer: Federal, State, Local not specified - PPO | Admitting: Family Medicine

## 2012-04-22 VITALS — BP 100/78 | HR 60 | Temp 98.1°F | Ht 68.5 in | Wt 172.4 lb

## 2012-04-22 DIAGNOSIS — E785 Hyperlipidemia, unspecified: Secondary | ICD-10-CM

## 2012-04-22 DIAGNOSIS — I10 Essential (primary) hypertension: Secondary | ICD-10-CM

## 2012-04-22 LAB — HEPATIC FUNCTION PANEL
Alkaline Phosphatase: 73 U/L (ref 39–117)
Bilirubin, Direct: 0 mg/dL (ref 0.0–0.3)
Total Bilirubin: 0.5 mg/dL (ref 0.3–1.2)

## 2012-04-22 LAB — LDL CHOLESTEROL, DIRECT: Direct LDL: 178 mg/dL

## 2012-04-22 LAB — LIPID PANEL
HDL: 78.8 mg/dL (ref >39.00)
Total CHOL/HDL Ratio: 3
Triglycerides: 73 mg/dL (ref 0.0–149.0)

## 2012-04-22 NOTE — Progress Notes (Signed)
  Subjective:    Patient ID: Duane Kirk, male    DOB: April 05, 1965, 47 y.o.   MRN: [id number]  HPI Hyperlipidemia- chronic problem, on fish oil.  No abd pain, N/V, myalgias.  HTN- chronic problem, excellent control today on Bystolic.  No CP, SOB, HAs, visual changes, edema, fatigue.   Review of Systems For ROS see HPI     Objective:   Physical Exam  Vitals reviewed. Constitutional: He is oriented to person, place, and time. He appears well-developed and well-nourished. No distress.  HENT:  Head: Normocephalic and atraumatic.  Eyes: Conjunctivae and EOM are normal. Pupils are equal, round, and reactive to light.  Neck: Normal range of motion. Neck supple. No thyromegaly present.  Cardiovascular: Normal rate, regular rhythm, normal heart sounds and intact distal pulses.   No murmur heard. Pulmonary/Chest: Effort normal and breath sounds normal. No respiratory distress.  Abdominal: Soft. Bowel sounds are normal. He exhibits no distension.  Musculoskeletal: He exhibits no edema.  Lymphadenopathy:    He has no cervical adenopathy.  Neurological: He is alert and oriented to person, place, and time. No cranial nerve deficit.  Skin: Skin is warm and dry.  Psychiatric: He has a normal mood and affect. His behavior is normal.          Assessment & Plan:

## 2012-04-22 NOTE — Patient Instructions (Addendum)
Schedule your complete physical in 6 months We'll notify you of your lab results and make any changes if needed Keep up the good work!  You look great! Happy Spring!!! 

## 2012-04-22 NOTE — Assessment & Plan Note (Signed)
Chronic problem.  Attempting to control w/ fish oil and exercise.  Check labs.  Start meds prn.

## 2012-04-22 NOTE — Assessment & Plan Note (Signed)
Chronic problem.  Excellent control on Bystolic.  Asymptomatic.  No changes.

## 2012-04-26 ENCOUNTER — Telehealth: Payer: Self-pay | Admitting: *Deleted

## 2012-04-26 MED ORDER — ATORVASTATIN CALCIUM 20 MG PO TABS: 20.0000 mg | ORAL_TABLET | Freq: Every day | ORAL | Status: DC

## 2012-04-26 NOTE — Telephone Encounter (Signed)
Rx sent to the pharmacy by e-script.//AB/CMA 

## 2012-04-26 NOTE — Telephone Encounter (Signed)
Message copied by Duane Kirk on Tue Apr 26, 2012  4:21 PM ------      Message from: Sheliah Hatch      Created: Sat Apr 23, 2012  5:33 PM       Total cholesterol and LDL have both increased.  LDL 136 --> 178.  Based on this, would start Lipitor 20mg  nightly ------

## 2012-05-16 ENCOUNTER — Telehealth: Payer: Self-pay | Admitting: Family Medicine

## 2012-05-16 DIAGNOSIS — I10 Essential (primary) hypertension: Secondary | ICD-10-CM

## 2012-05-16 NOTE — Telephone Encounter (Signed)
bystolic 10 mg tablet 10 mg  Qty: 90 No other info given on form

## 2012-05-17 MED ORDER — NEBIVOLOL HCL 10 MG PO TABS: 10.0000 mg | ORAL_TABLET | Freq: Every day | ORAL | Status: DC

## 2012-05-17 NOTE — Telephone Encounter (Signed)
Rx sent to the pharmacy by e-script.//AB/CMA 

## 2012-06-29 ENCOUNTER — Other Ambulatory Visit: Payer: Self-pay | Admitting: Family Medicine

## 2012-06-29 NOTE — Telephone Encounter (Signed)
Med filled.  

## 2012-08-02 ENCOUNTER — Telehealth: Payer: Self-pay | Admitting: Family Medicine

## 2012-08-02 DIAGNOSIS — R011 Cardiac murmur, unspecified: Secondary | ICD-10-CM

## 2012-08-02 NOTE — Telephone Encounter (Signed)
Ok for ECHO- dx heart murmur.  Please let him know we put order in and he will be contacted w/ appt.  Murmurs can be heard in times of physical illness, dehydration, etc.

## 2012-08-02 NOTE — Telephone Encounter (Signed)
Patient states he went to [facility] yesterday for his DOT PE and was found to have a heart murmur. 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 They are requiring that he either obtain his childhood medical records from [facility] or get referral from PCP for an echocardiogram. I advised pt to make an appointment with Dr. Beverely Low to address the issue and obtain referral, however patient declined. He states Dr. Beverely Low has not heard the murmur before and may not hear it if/when he comes in. He would like to know if she will still be able to refer him without hearing it. Patient saw Mariann Laster (P.A.?) @ [facility] ph# [phone]. Please advise.

## 2012-08-03 NOTE — Telephone Encounter (Signed)
Order entered, pt made aware.  

## 2012-08-09 ENCOUNTER — Ambulatory Visit (HOSPITAL_COMMUNITY): Payer: Federal, State, Local not specified - PPO | Attending: Family Medicine

## 2012-08-09 ENCOUNTER — Other Ambulatory Visit (HOSPITAL_COMMUNITY): Payer: Federal, State, Local not specified - PPO

## 2012-08-09 DIAGNOSIS — R011 Cardiac murmur, unspecified: Secondary | ICD-10-CM | POA: Insufficient documentation

## 2012-08-09 DIAGNOSIS — I10 Essential (primary) hypertension: Secondary | ICD-10-CM | POA: Insufficient documentation

## 2012-08-09 DIAGNOSIS — E785 Hyperlipidemia, unspecified: Secondary | ICD-10-CM | POA: Insufficient documentation

## 2012-08-09 NOTE — Progress Notes (Signed)
Echocardiogram performed.  

## 2012-08-12 ENCOUNTER — Encounter: Payer: Self-pay | Admitting: *Deleted

## 2012-08-12 ENCOUNTER — Telehealth: Payer: Self-pay | Admitting: Family Medicine

## 2012-08-12 NOTE — Telephone Encounter (Signed)
Patient is calling for the results of his Echocardiogram on 08/09/12.

## 2012-12-08 ENCOUNTER — Other Ambulatory Visit: Payer: Self-pay

## 2013-10-31 ENCOUNTER — Other Ambulatory Visit: Payer: Self-pay | Admitting: Orthopedic Surgery

## 2013-11-07 ENCOUNTER — Encounter (HOSPITAL_BASED_OUTPATIENT_CLINIC_OR_DEPARTMENT_OTHER): Payer: Self-pay | Admitting: *Deleted

## 2013-11-10 ENCOUNTER — Encounter (HOSPITAL_BASED_OUTPATIENT_CLINIC_OR_DEPARTMENT_OTHER)
Admission: RE | Disposition: A | Discharge status: Home / Self Care | Payer: Self-pay | Source: Ambulatory Visit | Attending: Orthopedic Surgery

## 2013-11-10 ENCOUNTER — Encounter (HOSPITAL_BASED_OUTPATIENT_CLINIC_OR_DEPARTMENT_OTHER): Payer: BC Managed Care – PPO | Admitting: Certified Registered"

## 2013-11-10 ENCOUNTER — Ambulatory Visit (HOSPITAL_BASED_OUTPATIENT_CLINIC_OR_DEPARTMENT_OTHER)
Admission: RE | Admit: 2013-11-10 | Discharge: 2013-11-10 | Disposition: A | Discharge status: Home / Self Care | Payer: BC Managed Care – PPO | Source: Ambulatory Visit | Attending: Orthopedic Surgery | Admitting: Orthopedic Surgery

## 2013-11-10 ENCOUNTER — Encounter (HOSPITAL_BASED_OUTPATIENT_CLINIC_OR_DEPARTMENT_OTHER): Payer: Self-pay | Admitting: *Deleted

## 2013-11-10 ENCOUNTER — Ambulatory Visit (HOSPITAL_BASED_OUTPATIENT_CLINIC_OR_DEPARTMENT_OTHER): Payer: BC Managed Care – PPO | Admitting: Certified Registered"

## 2013-11-10 DIAGNOSIS — M19041 Primary osteoarthritis, right hand: Secondary | ICD-10-CM | POA: Insufficient documentation

## 2013-11-10 DIAGNOSIS — M67843 Other specified disorders of tendon, right hand: Secondary | ICD-10-CM | POA: Insufficient documentation

## 2013-11-10 DIAGNOSIS — I10 Essential (primary) hypertension: Secondary | ICD-10-CM | POA: Insufficient documentation

## 2013-11-10 DIAGNOSIS — E785 Hyperlipidemia, unspecified: Secondary | ICD-10-CM | POA: Diagnosis not present

## 2013-11-10 DIAGNOSIS — M25841 Other specified joint disorders, right hand: Secondary | ICD-10-CM | POA: Diagnosis not present

## 2013-11-10 HISTORY — PX: EXCISION METACARPAL MASS: SHX6372

## 2013-11-10 LAB — POCT HEMOGLOBIN-HEMACUE: Hemoglobin: 16.5 g/dL (ref 13.0–17.0)

## 2013-11-10 SURGERY — EXCISION METACARPAL MASS
Anesthesia: General | Site: Finger | Laterality: Right

## 2013-11-10 MED ORDER — HYDROMORPHONE HCL 1 MG/ML IJ SOLN: 0.2500 mg | INTRAMUSCULAR | Status: DC | PRN

## 2013-11-10 MED ORDER — LIDOCAINE HCL (CARDIAC) 20 MG/ML IV SOLN
INTRAVENOUS | Status: DC | PRN
  Administered 2013-11-10: 60 mg via INTRAVENOUS

## 2013-11-10 MED ORDER — OXYCODONE HCL 5 MG PO TABS: 5.0000 mg | ORAL_TABLET | Freq: Once | ORAL | Status: DC | PRN

## 2013-11-10 MED ORDER — FENTANYL CITRATE 0.05 MG/ML IJ SOLN: 50.0000 ug | INTRAMUSCULAR | Status: DC | PRN

## 2013-11-10 MED ORDER — CHLORHEXIDINE GLUCONATE 4 % EX LIQD: 60.0000 mL | Freq: Once | CUTANEOUS | Status: DC

## 2013-11-10 MED ORDER — DEXAMETHASONE SODIUM PHOSPHATE 10 MG/ML IJ SOLN
INTRAMUSCULAR | Status: DC | PRN
  Administered 2013-11-10: 10 mg via INTRAVENOUS

## 2013-11-10 MED ORDER — FENTANYL CITRATE 0.05 MG/ML IJ SOLN
INTRAMUSCULAR | Status: AC
  Filled 2013-11-10: qty 6

## 2013-11-10 MED ORDER — HYDROCODONE-ACETAMINOPHEN 5-325 MG PO TABS: ORAL_TABLET | ORAL | Status: DC

## 2013-11-10 MED ORDER — LACTATED RINGERS IV SOLN
INTRAVENOUS | Status: DC
  Administered 2013-11-10 (×2): via INTRAVENOUS

## 2013-11-10 MED ORDER — FENTANYL CITRATE 0.05 MG/ML IJ SOLN
INTRAMUSCULAR | Status: DC | PRN
  Administered 2013-11-10: 100 ug via INTRAVENOUS
  Administered 2013-11-10: 25 ug via INTRAVENOUS

## 2013-11-10 MED ORDER — PROMETHAZINE HCL 25 MG/ML IJ SOLN: 6.2500 mg | INTRAMUSCULAR | Status: DC | PRN

## 2013-11-10 MED ORDER — BUPIVACAINE HCL (PF) 0.25 % IJ SOLN
INTRAMUSCULAR | Status: DC | PRN
  Administered 2013-11-10: 10 mL

## 2013-11-10 MED ORDER — CEFAZOLIN SODIUM-DEXTROSE 2-3 GM-% IV SOLR
2.0000 g | INTRAVENOUS | Status: AC
  Administered 2013-11-10: 2 g via INTRAVENOUS

## 2013-11-10 MED ORDER — MIDAZOLAM HCL 2 MG/2ML IJ SOLN
INTRAMUSCULAR | Status: AC
  Filled 2013-11-10: qty 2

## 2013-11-10 MED ORDER — PROPOFOL 10 MG/ML IV BOLUS
INTRAVENOUS | Status: DC | PRN
  Administered 2013-11-10: 200 mg via INTRAVENOUS

## 2013-11-10 MED ORDER — CEFAZOLIN SODIUM-DEXTROSE 2-3 GM-% IV SOLR
INTRAVENOUS | Status: AC
  Filled 2013-11-10: qty 50

## 2013-11-10 MED ORDER — MIDAZOLAM HCL 5 MG/5ML IJ SOLN
INTRAMUSCULAR | Status: DC | PRN
  Administered 2013-11-10: 2 mg via INTRAVENOUS

## 2013-11-10 MED ORDER — ONDANSETRON HCL 4 MG/2ML IJ SOLN
INTRAMUSCULAR | Status: DC | PRN
  Administered 2013-11-10: 4 mg via INTRAVENOUS

## 2013-11-10 MED ORDER — MIDAZOLAM HCL 2 MG/2ML IJ SOLN: 1.0000 mg | INTRAMUSCULAR | Status: DC | PRN

## 2013-11-10 MED ORDER — OXYCODONE HCL 5 MG/5ML PO SOLN: 5.0000 mg | Freq: Once | ORAL | Status: DC | PRN

## 2013-11-10 MED ORDER — BUPIVACAINE HCL (PF) 0.25 % IJ SOLN
INTRAMUSCULAR | Status: AC
  Filled 2013-11-10: qty 30

## 2013-11-10 SURGICAL SUPPLY — 53 items
APL SKNCLS STERI-STRIP NONHPOA (GAUZE/BANDAGES/DRESSINGS)
BANDAGE COBAN STERILE 2 (GAUZE/BANDAGES/DRESSINGS) IMPLANT
BANDAGE ELASTIC 3 VELCRO ST LF (GAUZE/BANDAGES/DRESSINGS) IMPLANT
BENZOIN TINCTURE PRP APPL 2/3 (GAUZE/BANDAGES/DRESSINGS) IMPLANT
BLADE MINI RND TIP GREEN BEAV (BLADE) IMPLANT
BLADE SURG 15 STRL LF DISP TIS (BLADE) ×2 IMPLANT
BLADE SURG 15 STRL SS (BLADE) ×6
BNDG CMPR 9X4 STRL LF SNTH (GAUZE/BANDAGES/DRESSINGS) ×1
BNDG CMPR MD 5X2 ELC HKLP STRL (GAUZE/BANDAGES/DRESSINGS)
BNDG COHESIVE 1X5 TAN STRL LF (GAUZE/BANDAGES/DRESSINGS) ×4 IMPLANT
BNDG CONFORM 2 STRL LF (GAUZE/BANDAGES/DRESSINGS) IMPLANT
BNDG ELASTIC 2 VLCR STRL LF (GAUZE/BANDAGES/DRESSINGS) IMPLANT
BNDG ESMARK 4X9 LF (GAUZE/BANDAGES/DRESSINGS) ×2 IMPLANT
BNDG GAUZE 1X2.1 STRL (MISCELLANEOUS) IMPLANT
BNDG GAUZE ELAST 4 BULKY (GAUZE/BANDAGES/DRESSINGS) IMPLANT
BNDG PLASTER X FAST 3X3 WHT LF (CAST SUPPLIES) IMPLANT
BNDG PLSTR 9X3 FST ST WHT (CAST SUPPLIES)
CHLORAPREP W/TINT 26ML (MISCELLANEOUS) ×3 IMPLANT
CLOSURE WOUND 1/2 X4 (GAUZE/BANDAGES/DRESSINGS)
CORDS BIPOLAR (ELECTRODE) ×3 IMPLANT
COVER BACK TABLE 60X90IN (DRAPES) ×3 IMPLANT
COVER MAYO STAND STRL (DRAPES) ×3 IMPLANT
CUFF TOURNIQUET SINGLE 18IN (TOURNIQUET CUFF) ×3 IMPLANT
DRAPE EXTREMITY TIBURON (DRAPES) ×3 IMPLANT
DRAPE SURG 17X23 STRL (DRAPES) ×3 IMPLANT
GAUZE SPONGE 4X4 12PLY STRL (GAUZE/BANDAGES/DRESSINGS) ×3 IMPLANT
GAUZE XEROFORM 1X8 LF (GAUZE/BANDAGES/DRESSINGS) ×3 IMPLANT
GLOVE BIO SURGEON STRL SZ7.5 (GLOVE) ×3 IMPLANT
GLOVE BIOGEL PI IND STRL 8 (GLOVE) ×1 IMPLANT
GLOVE BIOGEL PI INDICATOR 8 (GLOVE) ×2
GLOVE SURG SS PI 7.0 STRL IVOR (GLOVE) ×2 IMPLANT
GOWN STRL REUS W/ TWL LRG LVL3 (GOWN DISPOSABLE) ×1 IMPLANT
GOWN STRL REUS W/TWL LRG LVL3 (GOWN DISPOSABLE) ×3
GOWN STRL REUS W/TWL XL LVL3 (GOWN DISPOSABLE) ×3 IMPLANT
NDL HYPO 25X1 1.5 SAFETY (NEEDLE) ×1 IMPLANT
NEEDLE HYPO 25X1 1.5 SAFETY (NEEDLE) ×3 IMPLANT
NS IRRIG 1000ML POUR BTL (IV SOLUTION) ×3 IMPLANT
PACK BASIN DAY SURGERY FS (CUSTOM PROCEDURE TRAY) ×3 IMPLANT
PAD CAST 3X4 CTTN HI CHSV (CAST SUPPLIES) IMPLANT
PAD CAST 4YDX4 CTTN HI CHSV (CAST SUPPLIES) IMPLANT
PADDING CAST ABS 4INX4YD NS (CAST SUPPLIES) ×2
PADDING CAST ABS COTTON 4X4 ST (CAST SUPPLIES) ×1 IMPLANT
PADDING CAST COTTON 3X4 STRL (CAST SUPPLIES)
PADDING CAST COTTON 4X4 STRL (CAST SUPPLIES)
SPLINT FINGER 3.25 911903 (SOFTGOODS) ×4 IMPLANT
STOCKINETTE 4X48 STRL (DRAPES) ×3 IMPLANT
STRIP CLOSURE SKIN 1/2X4 (GAUZE/BANDAGES/DRESSINGS) IMPLANT
SUT ETHILON 3 0 PS 1 (SUTURE) IMPLANT
SUT ETHILON 4 0 PS 2 18 (SUTURE) ×3 IMPLANT
SYR BULB 3OZ (MISCELLANEOUS) ×3 IMPLANT
SYR CONTROL 10ML LL (SYRINGE) ×3 IMPLANT
TOWEL OR 17X24 6PK STRL BLUE (TOWEL DISPOSABLE) ×6 IMPLANT
UNDERPAD 30X30 INCONTINENT (UNDERPADS AND DIAPERS) ×3 IMPLANT

## 2013-11-10 NOTE — Op Note (Signed)
[id number] 

## 2013-11-10 NOTE — Discharge Instructions (Addendum)

## 2013-11-10 NOTE — Brief Op Note (Signed)
11/10/2013  2:18 PM  PATIENT:  Duane MeuseJoseph Kirk  48 y.o. 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   POST-OPERATIVE DIAGNOSIS:  Right Index and Long Mucoid Cyst   PROCEDURE:  Procedure(s): RIGHT INDEX AND LONG EXCISION MASS AND DEBRIDEMENT DISTAL INTERPHALANGEAL JOINT  (Right)  SURGEON:  Surgeon(s) and Role:    * Betha LoaKevin Ona Roehrs, MD - Primary  PHYSICIAN ASSISTANT:   ASSISTANTS: none   ANESTHESIA:   general  EBL:  Total I/O In: 1500 [I.V.:1500] Out: -   BLOOD ADMINISTERED:none  DRAINS: none   LOCAL MEDICATIONS USED:  MARCAINE     SPECIMEN:  Source of Specimen:  right index and long fingers  DISPOSITION OF SPECIMEN:  PATHOLOGY  COUNTS:  YES  TOURNIQUET:   Total Tourniquet Time Documented: Upper Arm (Right) - 28 minutes Total: Upper Arm (Right) - 28 minutes   DICTATION: .Other Dictation: Dictation Number [id number]  PLAN OF CARE: Discharge to home after PACU  PATIENT DISPOSITION:  PACU - hemodynamically stable.

## 2013-11-10 NOTE — H&P (Signed)
  Duane Kirk is an 48 y.o. male.   Chief Complaint: mucoid cyst right index and long fingers HPI: 48 yo rhd male with 2 months cyst on right index and long fingers.  Index finger cyst has leaked from under cuticle in past.  These are bothersome to him and he wishes to have them removed.  Past Medical History  Diagnosis Date  . Hyperlipidemia   . DOE (dyspnea on exertion)     ?hyperventilation syndrome  . Hypertension     off meds    Past Surgical History  Procedure Laterality Date  . Tendon repair  2008    left bicep tendon repair    Family History  Problem Relation Age of Onset  . Breast cancer Mother   . Lymphoma Father   . Gallbladder disease Mother    Social History:  reports that he has never smoked. He does not have any smokeless tobacco history on file. He reports that he drinks alcohol. He reports that he does not use illicit drugs.  Allergies: No Known Allergies  Medications Prior to Admission  Medication Sig Dispense Refill  . Ascorbic Acid (VITAMIN C CR) 500 MG TBCR Take by mouth daily.        . clonazePAM (KLONOPIN) 0.5 MG tablet Take 1 tablet (0.5 mg total) by mouth at bedtime as needed.  30 tablet  3  . Coenzyme Q10 (CO Q 10) 10 MG CAPS Take by mouth.      . fish oil-omega-3 fatty acids 1000 MG capsule Take 2 g by mouth daily.        Marland Kitchen. loratadine (CLARITIN) 10 MG tablet Take 10 mg by mouth daily.      . Multiple Vitamin (MULTIVITAMIN) capsule Take 1 capsule by mouth daily.        Marland Kitchen. testosterone cypionate (DEPOTESTOTERONE CYPIONATE) 100 MG/ML injection Inject 100 mg into the muscle every 7 (seven) days. For IM use only      . fluticasone (FLONASE) 50 MCG/ACT nasal spray Place 2 sprays into the nose daily.  1 g  6    Results for orders placed during the hospital encounter of 11/10/13 (from the past 48 hour(s))  POCT HEMOGLOBIN-HEMACUE     Status: None   Collection Time    11/10/13 11:54 AM      Result Value Ref Range   Hemoglobin 16.5  13.0 - 17.0 g/dL     No results found.   A comprehensive review of systems was negative.  Blood pressure 156/87, pulse 62, temperature 97.6 F (36.4 C), resp. rate 18, height 5\' 8"  (1.727 m), weight 80.457 kg (177 lb 6 oz), SpO2 100.00%.  General appearance: alert, cooperative and appears stated age Head: Normocephalic, without obvious abnormality, atraumatic Neck: supple, symmetrical, trachea midline Resp: clear to auscultation bilaterally Cardio: regular rate and rhythm GI: non tende4r Extremities: intact sensation and capillary refill all digits.  +epl/fpl/io.  cysts on dorsum right index and long fingers.  groove in index fingernail. Pulses: 2+ and symmetric Skin: Skin color, texture, turgor normal. 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  Non operative and operative treatment options were discussed with the patient and patient wishes to proceed with operative treatment. Risks, benefits, and alternatives of surgery were discussed and the patient agrees with the plan of care.   Duane Kirk R 11/10/2013, 12:52 PM

## 2013-11-10 NOTE — Anesthesia Preprocedure Evaluation (Signed)
Anesthesia Evaluation  Patient identified by MRN, date of birth, ID band Patient awake    Reviewed: Allergy & Precautions, H&P , NPO status , Patient's Chart, lab work & pertinent test results  History of Anesthesia Complications Negative for: history of anesthetic complications  Airway Mallampati: I      Dental   Pulmonary shortness of breath,  breath sounds clear to auscultation        Cardiovascular hypertension, + Peripheral Vascular Disease and + DOE Rhythm:Regular Rate:Normal     Neuro/Psych    GI/Hepatic   Endo/Other    Renal/GU      Musculoskeletal  (+) Arthritis -,   Abdominal   Peds  Hematology   Anesthesia Other Findings   Reproductive/Obstetrics                           Anesthesia Physical Anesthesia Plan  ASA: II  Anesthesia Plan: General   Post-op Pain Management:    Induction: Intravenous  Airway Management Planned: LMA  Additional Equipment:   Intra-op Plan:   Post-operative Plan: Extubation in OR  Informed Consent: I have reviewed the patients History and Physical, chart, labs and discussed the procedure including the risks, benefits and alternatives for the proposed anesthesia with the patient or authorized representative who has indicated his/her understanding and acceptance.   Dental advisory given  Plan Discussed with: CRNA and Surgeon  Anesthesia Plan Comments:         Anesthesia Quick Evaluation

## 2013-11-10 NOTE — Transfer of Care (Signed)
Immediate Anesthesia Transfer of Care Note  Patient: Duane MeuseJoseph Kirk  Procedure(s) Performed: Procedure(s): RIGHT INDEX AND LONG EXCISION MASS AND DEBRIDEMENT DISTAL INTERPHALANGEAL JOINT  (Right)  Patient Location: PACU  Anesthesia Type:General  Level of Consciousness: awake and patient cooperative  Airway & Oxygen Therapy: Patient Spontanous Breathing and Patient connected to face mask oxygen  Post-op Assessment: Report given to PACU RN and Post -op Vital signs reviewed and stable  Post vital signs: Reviewed and stable  Complications: No apparent anesthesia complications

## 2013-11-10 NOTE — Op Note (Signed)
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  PROCEDURES: 1. Right index finger excision of mucoid cyst and debridement of     distal interphalangeal joint. 2. Right long finger excision of mucoid cyst and debridement of distal     interphalangeal joint.  SURGEON:  Betha LoaKevin Casidee Jann, MD  ASSISTANT:  None.  ANESTHESIA:  General.  IV FLUIDS:  Per anesthesia flow sheet.  ESTIMATED BLOOD LOSS:  Minimal.  COMPLICATIONS:  None.  SPECIMENS:  Right index and long finger mass to Pathology.  TOURNIQUET TIME:  28 minutes.  DISPOSITION:  Stable to PACU.  INDICATIONS:  Duane Kirk is a 48 year old right-hand dominant male, who was had cysts on the index and long finger for approximately 6 months. These are bothersome to him.  He wished to have them removed.  Risks, benefits and alternatives of surgery were reviewed including the risk of blood loss; infection; damage to nerves, vessels, tendons, ligaments, bone; failure of surgery; need for additional surgery; complications with wound healing; continued pain; and recurrence of the cyst.  He was voiced understanding of these risks and elected to proceed.  OPERATIVE COURSE:  After being identified preoperatively by myself, the patient and I agreed upon the procedure and site of procedure.  Surgical site was marked.  Risks, benefits, and alternatives of surgery were reviewed and he wished to proceed.  Surgical consent had been signed. He was given IV Ancef as  preoperative antibiotic prophylaxis.  He was transferred to the operating room and placed on the operating room table in supine position with the right upper extremity on an armboard. General anesthesia was induced by Anesthesiology.  Right upper extremity was prepped and draped in normal sterile orthopedic fashion.  A surgical pause was performed between the surgeons, anesthesia, and operating room staff, and all were in agreement as to patient, procedure and site of procedure.  Tourniquet at the proximal aspect of the extremity was inflated to 250 mmHg after exsanguination of the limb with Esmarch bandage.  The long finger was addressed first.  A hockey stick-shaped incision was made at the DIP joint, carried into the subcutaneous tissues by spreading technique.  The cyst was identified and excised. Sent to Pathology for examination.  The DIP joint was entered from underneath the extensor tendon and debrided using the synovectomy rongeurs.  Attention was turned to the index finger.  Again, a hockey stick-shaped incision was made and entered into the subcutaneous tissues by spreading technique.  The cyst was identified and incised.  It was also sent to Pathology for examination.  The DIP joint was entered underneath the extensor tendon and debrided using the  synovectomy rongeurs.  The wounds were both copiously irrigated with sterile saline. They were closed with 4-0 nylon in a horizontal mattress fashion. Bipolar electrocautery had been used to obtain hemostasis.  Wounds were dressed with sterile Xeroform, 4x4s, and wrapped with Coban dressing lightly.  An AlumaFoam splint was placed and wrapped lightly with Coban dressing.  Tourniquet was deflated at 28 minutes.  Fingertips were pink with brisk capillary refill after deflation of tourniquet.  Operative drapes were broken down and the patient was awoken from anesthesia safely.  He was transferred back to the stretcher and taken to PACU  in stable condition.  I will see him back in the office in 1 week for postoperative followup.  I will give him Norco 5/325, 1-2 p.o. q.6 hours p.r.n. pain, dispensed #30.     Betha LoaKevin Dajiah Kooi, MD     KK/MEDQ  D:  11/10/2013  T:  11/10/2013  Job:  [id number]

## 2013-11-10 NOTE — Anesthesia Procedure Notes (Signed)
Procedure Name: LMA Insertion Date/Time: 11/10/2013 1:37 PM Performed by: Keyira Mondesir Pre-anesthesia Checklist: Patient identified, Emergency Drugs available, Suction available and Patient being monitored Patient Re-evaluated:Patient Re-evaluated prior to inductionOxygen Delivery Method: Circle System Utilized Preoxygenation: Pre-oxygenation with 100% oxygen Intubation Type: IV induction Ventilation: Mask ventilation without difficulty LMA: LMA inserted LMA Size: 4.0 Number of attempts: 1 Airway Equipment and Method: bite block Placement Confirmation: positive ETCO2 Tube secured with: Tape Dental Injury: Teeth and Oropharynx as per pre-operative assessment

## 2013-11-11 NOTE — Anesthesia Postprocedure Evaluation (Signed)
  Anesthesia Post-op Note  Patient: Duane Kirk  Procedure(s) Performed: Procedure(s): RIGHT INDEX AND LONG EXCISION MASS AND DEBRIDEMENT DISTAL INTERPHALANGEAL JOINT  (Right)  Patient Location: PACU  Anesthesia Type:General  Level of Consciousness: awake and alert   Airway and Oxygen Therapy: Patient Spontanous Breathing  Post-op Pain: mild  Post-op Assessment: Post-op Vital signs reviewed  Post-op Vital Signs: stable  Last Vitals:  Filed Vitals:   11/10/13 1529  BP: 140/88  Pulse: 81  Temp: 36.4 C  Resp: 16    Complications: No apparent anesthesia complications

## 2013-11-13 ENCOUNTER — Encounter (HOSPITAL_BASED_OUTPATIENT_CLINIC_OR_DEPARTMENT_OTHER): Payer: Self-pay | Admitting: Orthopedic Surgery

## 2015-12-28 ENCOUNTER — Emergency Department (HOSPITAL_COMMUNITY): Payer: BLUE CROSS/BLUE SHIELD

## 2015-12-28 ENCOUNTER — Emergency Department (HOSPITAL_COMMUNITY)
Admission: EM | Admit: 2015-12-28 | Discharge: 2015-12-28 | Disposition: A | Discharge status: Home / Self Care | Payer: BLUE CROSS/BLUE SHIELD | Attending: Emergency Medicine | Admitting: Emergency Medicine

## 2015-12-28 ENCOUNTER — Encounter (HOSPITAL_COMMUNITY): Payer: Self-pay | Admitting: *Deleted

## 2015-12-28 DIAGNOSIS — S0990XA Unspecified injury of head, initial encounter: Secondary | ICD-10-CM | POA: Diagnosis present

## 2015-12-28 DIAGNOSIS — S42152A Displaced fracture of neck of scapula, left shoulder, initial encounter for closed fracture: Secondary | ICD-10-CM

## 2015-12-28 DIAGNOSIS — M545 Low back pain: Secondary | ICD-10-CM | POA: Diagnosis not present

## 2015-12-28 DIAGNOSIS — Z79899 Other long term (current) drug therapy: Secondary | ICD-10-CM | POA: Insufficient documentation

## 2015-12-28 DIAGNOSIS — Y9259 Other trade areas as the place of occurrence of the external cause: Secondary | ICD-10-CM | POA: Insufficient documentation

## 2015-12-28 DIAGNOSIS — W11XXXA Fall on and from ladder, initial encounter: Secondary | ICD-10-CM | POA: Diagnosis not present

## 2015-12-28 DIAGNOSIS — S0101XA Laceration without foreign body of scalp, initial encounter: Secondary | ICD-10-CM

## 2015-12-28 DIAGNOSIS — S42142A Displaced fracture of glenoid cavity of scapula, left shoulder, initial encounter for closed fracture: Secondary | ICD-10-CM | POA: Diagnosis not present

## 2015-12-28 DIAGNOSIS — Y939 Activity, unspecified: Secondary | ICD-10-CM | POA: Insufficient documentation

## 2015-12-28 DIAGNOSIS — S060X1A Concussion with loss of consciousness of 30 minutes or less, initial encounter: Secondary | ICD-10-CM

## 2015-12-28 DIAGNOSIS — Y999 Unspecified external cause status: Secondary | ICD-10-CM | POA: Insufficient documentation

## 2015-12-28 DIAGNOSIS — I10 Essential (primary) hypertension: Secondary | ICD-10-CM | POA: Diagnosis not present

## 2015-12-28 MED ORDER — HYDROCODONE-ACETAMINOPHEN 5-325 MG PO TABS: 1.0000 | ORAL_TABLET | Freq: Four times a day (QID) | ORAL | 0 refills | Status: DC | PRN

## 2015-12-28 MED ORDER — NAPROXEN 250 MG PO TABS: 250.0000 mg | ORAL_TABLET | Freq: Two times a day (BID) | ORAL | 0 refills | Status: DC

## 2015-12-28 MED ORDER — BACITRACIN ZINC 500 UNIT/GM EX OINT: 1.0000 "application " | TOPICAL_OINTMENT | Freq: Two times a day (BID) | CUTANEOUS | 1 refills | Status: DC

## 2015-12-28 MED ORDER — FENTANYL CITRATE (PF) 100 MCG/2ML IJ SOLN
100.0000 ug | INTRAMUSCULAR | Status: DC | PRN
  Administered 2015-12-28: 100 ug via INTRAVENOUS
  Filled 2015-12-28: qty 2

## 2015-12-28 NOTE — ED Notes (Signed)
Patient head laceration cleaned. EDP placing staple. Will dress wound afterwards.

## 2015-12-28 NOTE — ED Notes (Signed)
Patient at CT

## 2015-12-28 NOTE — ED Notes (Signed)
EDP at bedside  

## 2015-12-28 NOTE — ED Notes (Signed)
Dressing on head changed

## 2015-12-28 NOTE — ED Notes (Signed)
Ortho tech to come and apply shoulder sling/immoblizer

## 2015-12-28 NOTE — ED Notes (Signed)
2 staples placed by EDP. Dressing completed.

## 2015-12-28 NOTE — ED Notes (Signed)
Patient at xray

## 2015-12-28 NOTE — ED Triage Notes (Addendum)
[facility] fall off of ladder while trying to retreive Christmas stuff in attic. Wife witnessed fall. Patient denies dizziness - it was a mechanical fal. Wife states poss loss of consciousness. Patient is having short term memory loss - ability to remember standing on the ladder and then after the fall.  Patient has injuries to left side of head laceration and left shoulder and lower back pain. 20 in LFA. Patient received 200mg  fent in route and 200 of fluid. EMS bp 140/93.

## 2015-12-28 NOTE — Discharge Instructions (Signed)
Please follow-up with orthopedic surgery. Staples out in 5-7 days.

## 2015-12-28 NOTE — ED Notes (Signed)
Patient at CT/xray

## 2015-12-28 NOTE — ED Provider Notes (Signed)
[facility]-EMERGENCY DEPT Provider Note   CSN: [id number] Arrival date & time: 12/28/15  4098     History   Chief Complaint No chief complaint on file.   HPI Duane Kirk is a 50 y.o. male.  Duane Kirk is a 50 y.o. Male who presents to the ED via EMS after a slip and fall today. Patient was on a ladder up in his attic above his garage when he slipped and fell approximately 10 feet onto concrete floor. This was witnessed by his wife. She believes he may have lost consciousness briefly for a second. She reports after the fall the patient asked what happened and was briefly unsure of what happened. She reports since he has been acting appropriately. She notes the patient had some bleeding to the posterior aspect of his head. Patient complains of left shoulder pain, left low back pain and some mild pain to the back of his head. He has received fentanyl by EMS in route. He has been placed in a c-collar. He denies any neck pain. He reports his last tetanus shot was in 2014. Patient denies fevers, double vision, numbness, tingling, weakness, neck pain, leg pain, urinary symptoms, chest pain, shortness of breath, abdominal pain, nausea, vomiting or rashes.   The history is provided by the patient and the spouse. No language interpreter was used.    Past Medical History:  Diagnosis Date  . DOE (dyspnea on exertion)    ?hyperventilation syndrome  . Hyperlipidemia   . Hypertension    off meds    Patient Active Problem List   Diagnosis Date Noted  . HTN (hypertension) 12/21/2011  . Insomnia 12/21/2011  . Hematuria 09/28/2011  . General medical examination 11/04/2010  . Erectile dysfunction 09/02/2010  . DYSPNEA 02/07/2010  . EXERCISE INDUCED BRONCHOSPASM 12/16/2009  . RAYNAUD'S SYNDROME 03/14/2009  . PLEURISY 03/14/2009  . FATIGUE 03/14/2009  . HYPERPOTASSEMIA 02/21/2009  . LATERAL EPICONDYLITIS, RIGHT 08/27/2008  . HYPERLIPIDEMIA 05/28/2008  . HEMATURIA 09/17/2006  . ABNORMAL  FINDINGS, ELEVATED BP W/O HTN 09/17/2006    Past Surgical History:  Procedure Laterality Date  . EXCISION METACARPAL MASS Right 11/10/2013   Procedure: RIGHT INDEX AND LONG EXCISION MASS AND DEBRIDEMENT DISTAL INTERPHALANGEAL JOINT ;  Surgeon: Betha Loa, MD;  Location: [facility];  Service: Orthopedics;  Laterality: Right;  . TENDON REPAIR  2008   left bicep tendon repair       Home Medications    Prior to Admission medications   Medication Sig Start Date End Date Taking? Authorizing Provider  albuterol (PROVENTIL HFA;VENTOLIN HFA) 108 (90 Base) MCG/ACT inhaler Inhale 1 puff into the lungs every 4 (four) hours as needed for wheezing. 10/08/15 10/07/16 Yes Historical Provider, MD  Ascorbic Acid (VITAMIN C CR) 500 MG TBCR Take by mouth daily.     Yes Historical Provider, MD  Coenzyme Q10 (CO Q 10) 10 MG CAPS Take by mouth.   Yes Historical Provider, MD  fish oil-omega-3 fatty acids 1000 MG capsule Take 2 g by mouth daily.     Yes Historical Provider, MD  ibuprofen (ADVIL,MOTRIN) 200 MG tablet Take 200 mg by mouth every 6 (six) hours as needed for moderate pain.   Yes Historical Provider, MD  loratadine (CLARITIN) 10 MG tablet Take 10 mg by mouth daily.   Yes Historical Provider, MD  Multiple Vitamin (MULTIVITAMIN) capsule Take 1 capsule by mouth daily.     Yes Historical Provider, MD  NATURE-THROID 65 MG tablet Take 1 tablet by mouth  daily. 12/17/15  Yes Historical Provider, MD  Nebivolol HCl (BYSTOLIC) 20 MG TABS Take 20 mg by mouth daily.   Yes Historical Provider, MD  testosterone cypionate (DEPOTESTOTERONE CYPIONATE) 100 MG/ML injection Inject 100 mg into the muscle every 7 (seven) days. For IM use only   Yes Historical Provider, MD  bacitracin ointment Apply 1 application topically 2 (two) times daily. 12/28/15   Everlene Farrier, PA-C  clonazePAM (KLONOPIN) 0.5 MG tablet Take 1 tablet (0.5 mg total) by mouth at bedtime as needed. Patient not taking: Reported on 12/28/2015  01/28/12   Sheliah Hatch, MD  fluticasone [facility]) 50 MCG/ACT nasal spray Place 2 sprays into the nose daily. 06/27/11 06/26/12  Dois Davenport, MD  HYDROcodone-acetaminophen (NORCO/VICODIN) 5-325 MG tablet Take 1-2 tablets by mouth every 6 (six) hours as needed for moderate pain or severe pain. 12/28/15   Everlene Farrier, PA-C  naproxen (NAPROSYN) 250 MG tablet Take 1 tablet (250 mg total) by mouth 2 (two) times daily with a meal. 12/28/15   Everlene Farrier, PA-C    Family History Family History  Problem Relation Age of Onset  . Breast cancer Mother   . Gallbladder disease Mother   . Lymphoma Father     Social History Social History  Substance Use Topics  . Smoking status: Never Smoker  . Smokeless tobacco: Never Used  . Alcohol use Yes     Comment: occ.     Allergies   Patient has no known allergies.   Review of Systems Review of Systems  Constitutional: Negative for fever.  HENT: Negative for ear discharge and nosebleeds.   Eyes: Negative for pain and visual disturbance.  Respiratory: Negative for cough and shortness of breath.   Cardiovascular: Negative for chest pain.  Gastrointestinal: Negative for abdominal pain, diarrhea, nausea and vomiting.  Genitourinary: Negative for difficulty urinating, dysuria and hematuria.  Musculoskeletal: Positive for arthralgias and back pain. Negative for neck pain and neck stiffness.  Skin: Positive for wound. Negative for rash.  Neurological: Positive for syncope. Negative for dizziness, seizures, speech difficulty, weakness, light-headedness, numbness and headaches.     Physical Exam Updated Vital Signs BP 140/77 (BP Location: Right Arm)   Pulse 76   Temp 97.9 F (36.6 C) (Oral)   Resp 18   Ht 5\' 9"  (1.753 m)   Wt 77.1 kg   SpO2 98%   BMI 25.10 kg/m   Physical Exam  Constitutional: He is oriented to person, place, and time. He appears well-developed and well-nourished. No distress.  Nontoxic appearing.  HENT:    Head: Normocephalic.  Right Ear: External ear normal.  Left Ear: External ear normal.  Nose: Nose normal.  Mouth/Throat: Oropharynx is clear and moist.  Laceration and hematoma noted to his left posterior scalp. No other visible or palpated signs of head trauma.  Eyes: Conjunctivae and EOM are normal. Pupils are equal, round, and reactive to light. Right eye exhibits no discharge. Left eye exhibits no discharge.  Neck: Neck supple. No tracheal deviation present.  Patient wearing c-collar.  Cardiovascular: Normal rate, regular rhythm, normal heart sounds and intact distal pulses.  Exam reveals no gallop and no friction rub.   No murmur heard. Bilateral radial, posterior tibialis and dorsalis pedis pulses are intact.    Pulmonary/Chest: Effort normal and breath sounds normal. No stridor. No respiratory distress. He has no wheezes. He has no rales. He exhibits no tenderness.  Lungs clear to auscultation bilaterally. No chest wall tenderness to palpation. Symmetric chest  expansion bilaterally.  Abdominal: Soft. There is no tenderness. There is no guarding.  Abdomen is soft and nontender to palpation.  Musculoskeletal: He exhibits tenderness. He exhibits no edema or deformity.  Tenderness to his left anterior shoulder. No clavicle tenderness bilaterally. Patient's bilateral elbow, wrist, hip, knee and ankle joints are supple and nontender to palpation. Abrasion noted superficially to his left shin. No bony point tenderness. No midline neck or back tenderness. Mild tenderness to his left lateral low back. No back erythema, deformity, ecchymosis or warmth. Good strength to his bilateral upper and lower extremities.  Lymphadenopathy:    He has no cervical adenopathy.  Neurological: He is alert and oriented to person, place, and time. No cranial nerve deficit. Coordination normal.  Patient is alert and oriented 3. Cranial nerves are intact. Speech is clear and coherent. Sensation is intact in his  bilateral upper and lower extremity.  Skin: Skin is warm and dry. Capillary refill takes less than 2 seconds. No rash noted. He is not diaphoretic. No erythema. No pallor.  Psychiatric: He has a normal mood and affect. His behavior is normal.  Nursing note and vitals reviewed.    ED Treatments / Results  Labs (all labs ordered are listed, but only abnormal results are displayed) Labs Reviewed - No data to display  EKG  EKG Interpretation None       Radiology Dg Ribs Unilateral W/chest Left  Result Date: 12/28/2015 CLINICAL DATA:  50 year old male with a history of fall. EXAM: LEFT RIBS AND CHEST - 3+ VIEW COMPARISON:  02/07/2010 FINDINGS: Cardiomediastinal silhouette within normal limits. No pneumothorax or pleural effusion.  No confluent airspace disease. No acute displaced rib fracture identified. IMPRESSION: No radiographic evidence of acute cardiopulmonary disease. No acute displaced rib fracture. Signed, Yvone Neu. Loreta Ave, DO [facility] Electronically Signed   By: Gilmer Mor D.O.   On: 12/28/2015 12:47   Dg Lumbar Spine Complete  Result Date: 12/28/2015 CLINICAL DATA:  Recent fall from ladder with low back pain, initial encounter EXAM: LUMBAR SPINE - COMPLETE 4+ VIEW COMPARISON:  None. FINDINGS: There is no evidence of lumbar spine fracture. Alignment is normal. Intervertebral disc spaces are maintained. IMPRESSION: No acute abnormality noted. Electronically Signed   By: Alcide Clever M.D.   On: 12/28/2015 11:02   Ct Head Wo Contrast  Result Date: 12/28/2015 CLINICAL DATA:  Fall 10 feet from ladder on concrete, LOC EXAM: CT HEAD WITHOUT CONTRAST CT CERVICAL SPINE WITHOUT CONTRAST TECHNIQUE: Multidetector CT imaging of the head and cervical spine was performed following the standard protocol without intravenous contrast. Multiplanar CT image reconstructions of the cervical spine were also generated. COMPARISON:  none  FINDINGS: CT HEAD FINDINGS Brain: No intracranial hemorrhage, mass effect or midline shift. No acute cortical infarction. No mass lesion is noted on this unenhanced scan. Vascular: No hyperdense vessel or unexpected calcification. Skull: Normal. Negative for fracture or focal lesion. Sinuses/Orbits: Small air-fluid level noted posterior aspect of the right maxillary sinus. The mastoid air cells are unremarkable. Other: There is scalp swelling and small amount of soft tissue air left posterior parietal region please see axial image 21. A subcutaneous hematoma measures 1.8 by 1.1 cm. CT CERVICAL SPINE FINDINGS Alignment: Normal alignment of the cervical spine. Skull base and vertebrae: There are degenerative changes C1-C2 articulation. No acute fracture or subluxation. Soft tissues and spinal canal: No prevertebral soft tissue swelling. Spinal canal is patent. Disc levels: There is disc space flattening with mild  anterior and mild posterior spurring at C4-C5-C5-C6 and C6-C7 level. Upper chest: The visualized lung apices shows no evidence of pneumothorax. Other: None IMPRESSION: 1. No acute intracranial abnormality. There is scalp swelling and small soft tissue air within left posterior parietal scalp axial image 21. A subcutaneous hematoma axial image 21 in left parietal scalp measures 1.8 x 1.1 cm. 2. No cervical spine acute fracture or subluxation. Degenerative changes as described above. 3. No skull fracture is noted. 4. There is small air-fluid level posterior aspect of the right maxillary sinus. Electronically Signed   By: Natasha Mead M.D.   On: 12/28/2015 11:29   Ct Cervical Spine Wo Contrast  Result Date: 12/28/2015 CLINICAL DATA:  Fall 10 feet from ladder on concrete, LOC EXAM: CT HEAD WITHOUT CONTRAST CT CERVICAL SPINE WITHOUT CONTRAST TECHNIQUE: Multidetector CT imaging of the head and cervical spine was performed following the standard protocol without intravenous contrast. Multiplanar CT image  reconstructions of the cervical spine were also generated. COMPARISON:  none FINDINGS: CT HEAD FINDINGS Brain: No intracranial hemorrhage, mass effect or midline shift. No acute cortical infarction. No mass lesion is noted on this unenhanced scan. Vascular: No hyperdense vessel or unexpected calcification. Skull: Normal. Negative for fracture or focal lesion. Sinuses/Orbits: Small air-fluid level noted posterior aspect of the right maxillary sinus. The mastoid air cells are unremarkable. Other: There is scalp swelling and small amount of soft tissue air left posterior parietal region please see axial image 21. A subcutaneous hematoma measures 1.8 by 1.1 cm. CT CERVICAL SPINE FINDINGS Alignment: Normal alignment of the cervical spine. Skull base and vertebrae: There are degenerative changes C1-C2 articulation. No acute fracture or subluxation. Soft tissues and spinal canal: No prevertebral soft tissue swelling. Spinal canal is patent. Disc levels: There is disc space flattening with mild anterior and mild posterior spurring at C4-C5-C5-C6 and C6-C7 level. Upper chest: The visualized lung apices shows no evidence of pneumothorax. Other: None IMPRESSION: 1. No acute intracranial abnormality. There is scalp swelling and small soft tissue air within left posterior parietal scalp axial image 21. A subcutaneous hematoma axial image 21 in left parietal scalp measures 1.8 x 1.1 cm. 2. No cervical spine acute fracture or subluxation. Degenerative changes as described above. 3. No skull fracture is noted. 4. There is small air-fluid level posterior aspect of the right maxillary sinus. Electronically Signed   By: Natasha Mead M.D.   On: 12/28/2015 11:29   Dg Shoulder Left  Result Date: 12/28/2015 CLINICAL DATA:  Left shoulder pain, fall 10 feet from ladder on concrete EXAM: LEFT SHOULDER - 2+ VIEW COMPARISON:  None. FINDINGS: Two views of the left shoulder submitted. No shoulder dislocation. There is lucent line in left  glenoid suspicious for nondisplaced fracture of the left scapula. Clinical correlation is necessary. Further correlation with CT scan or MRI could be performed. IMPRESSION: No shoulder dislocation. There is lucent line in left glenoid suspicious for nondisplaced fracture of the left scapula. Clinical correlation is necessary. Further correlation with CT scan or MRI could be performed. Electronically Signed   By: Natasha Mead M.D.   On: 12/28/2015 11:01    Procedures .Marland KitchenLaceration Repair Date/Time: 12/28/2015 1:04 PM Performed by: Everlene Farrier Authorized by: Everlene Farrier   Consent:    Consent obtained:  Verbal   Consent given by:  Patient   Risks discussed:  Infection, pain and need for additional repair Anesthesia (see MAR for exact dosages):    Anesthesia method:  None Laceration details:  Location:  Scalp   Scalp location:  Occipital   Length (cm):  3 Repair type:    Repair type:  Simple Pre-procedure details:    Preparation:  Patient was prepped and draped in usual sterile fashion Exploration:    Hemostasis achieved with:  Direct pressure   Wound exploration: entire depth of wound probed and visualized   Treatment:    Area cleansed with:  Saline   Amount of cleaning:  Extensive   Irrigation solution:  Sterile saline   Irrigation method:  Syringe Skin repair:    Repair method:  Staples   Number of staples:  2 Approximation:    Approximation:  Close Post-procedure details:    Dressing:  Antibiotic ointment and non-adherent dressing   Patient tolerance of procedure:  Tolerated well, no immediate complications   (including critical care time)  Medications Ordered in ED Medications  fentaNYL (SUBLIMAZE) injection 100 mcg (100 mcg Intravenous Given 12/28/15 1145)     Initial Impression / Assessment and Plan / ED Course  I have reviewed the triage vital signs and the nursing notes.  Pertinent labs & imaging results that were available during my care of the patient  were reviewed by me and considered in my medical decision making (see chart for details).  Clinical Course    This  is a 50 y.o. Male who presents to the ED via EMS after a slip and fall today. Patient was on a ladder up in his attic above his garage when he slipped and fell approximately 10 feet onto concrete floor. This was witnessed by his wife. She believes he may have lost consciousness briefly for a second. She reports after the fall the patient asked what happened and was briefly unsure of what happened. She reports since he has been acting appropriately. She notes the patient had some bleeding to the posterior aspect of his head. Patient complains of left shoulder pain, left low back pain and some mild pain to the back of his head. He has received fentanyl by EMS in route. He has been placed in a c-collar. He denies any neck pain. He reports his last tetanus shot was in 2014. He denies any chest pain, shortness of breath, numbness, tingling or weakness. On exam the patient is afebrile nontoxic appearing. He has no focal neurological deficits. He has tenderness to the anterior aspect of his left shoulder. He has some macerated tissue to his posterior left scalp. No midline neck or back tenderness. Abdomen is soft and nontender. CT head shows no acute intracranial abnormality. CT cervical spine shows no acute fracture or subluxation. No skull fracture identified. Left shoulder x-ray is suspicious for left glenoid nondisplaced fracture. Lumbar films are unremarkable. Chest with left ribs are also unremarkable. No pneumothorax. No rib fracture.  Patient has remained hemodynamically stable during his ER visit. No abdominal pain, chest pain or shortness of breath. He has no focal neurological deficits. Laceration repaired by me with staples. Staples out in 5-7 days. I discussed wound care instructions. Patient Plaisance shoulder immobilizer and I will have him follow-up with orthopedic surgery. I discussed  signs and symptoms of concussion. I discussed expected course and treatment of a concussion. I discussed head injury return precautions. Will discharge with prescriptions for naproxen and Norco for breakthrough pain. I discussed narcotic pain medication precautions. I advised the patient to follow-up with their primary care provider this week. I advised the patient to return to the emergency department with new or worsening symptoms or  new concerns. The patient verbalized understanding and agreement with plan.    This patient was discussed with Dr. Effie ShyWentz who agrees with assessment and plan.     Final Clinical Impressions(s) / ED Diagnoses   Final diagnoses:  Glenoid fracture of shoulder, left, closed, initial encounter  Accidental fall from ladder, initial encounter  Acute left-sided low back pain, with sciatica presence unspecified  Laceration of scalp without foreign body, initial encounter  Concussion with loss of consciousness of 30 minutes or less, initial encounter    New Prescriptions New Prescriptions   BACITRACIN OINTMENT    Apply 1 application topically 2 (two) times daily.   HYDROCODONE-ACETAMINOPHEN (NORCO/VICODIN) 5-325 MG TABLET    Take 1-2 tablets by mouth every 6 (six) hours as needed for moderate pain or severe pain.   NAPROXEN (NAPROSYN) 250 MG TABLET    Take 1 tablet (250 mg total) by mouth 2 (two) times daily with a meal.     Everlene FarrierWilliam Erich Kochan, PA-C 12/28/15 1333    Mancel BaleElliott Wentz, MD 12/28/15 [phone]

## 2015-12-30 ENCOUNTER — Encounter (INDEPENDENT_AMBULATORY_CARE_PROVIDER_SITE_OTHER): Payer: Self-pay | Admitting: Orthopaedic Surgery

## 2015-12-30 ENCOUNTER — Ambulatory Visit (INDEPENDENT_AMBULATORY_CARE_PROVIDER_SITE_OTHER): Payer: BLUE CROSS/BLUE SHIELD | Admitting: Orthopaedic Surgery

## 2015-12-30 DIAGNOSIS — S42152A Displaced fracture of neck of scapula, left shoulder, initial encounter for closed fracture: Secondary | ICD-10-CM | POA: Diagnosis not present

## 2015-12-30 DIAGNOSIS — S42142A Displaced fracture of glenoid cavity of scapula, left shoulder, initial encounter for closed fracture: Secondary | ICD-10-CM | POA: Diagnosis not present

## 2015-12-30 MED ORDER — METHOCARBAMOL 500 MG PO TABS: 500.0000 mg | ORAL_TABLET | Freq: Four times a day (QID) | ORAL | 2 refills | Status: DC | PRN

## 2015-12-30 MED ORDER — OXYCODONE-ACETAMINOPHEN 5-325 MG PO TABS: 1.0000 | ORAL_TABLET | ORAL | 0 refills | Status: DC | PRN

## 2015-12-30 NOTE — Progress Notes (Signed)
Office Visit Note   Patient: Duane MeuseJoseph Kirk           Date of Birth: 12/01/1965           MRN: [id number] Visit Date: 12/30/2015              Requested by: Sheliah HatchKatherine E Tabori, MD [address] [place], [place] [id number] PCP: Neena RhymesKatherine Tabori, MD   Assessment & Plan: Visit Diagnoses:  1. Glenoid fracture of shoulder, left, closed, initial encounter     Plan: CT scan of the shoulder order to better evaluate the glenoid fracture. I will contact the patient with the results. Sling at all times. Nonweightbearing.  Follow-Up Instructions: Return for will call patient with CT results.   Orders:  Orders Placed This Encounter  Procedures  . CT SHOULDER LEFT WO CONTRAST   Meds ordered this encounter  Medications  . oxyCODONE-acetaminophen (PERCOCET) 5-325 MG tablet    Sig: Take 1 tablet by mouth every 4 (four) hours as needed for severe pain.    Dispense:  30 tablet    Refill:  0  . methocarbamol (ROBAXIN) 500 MG tablet    Sig: Take 1 tablet (500 mg total) by mouth every 6 (six) hours as needed for muscle spasms.    Dispense:  30 tablet    Refill:  2      Procedures: No procedures performed   Clinical Data: No additional findings.   Subjective: Chief Complaint  Patient presents with  . Left Shoulder - Pain    HPI Patient is a 50 year old who had a 10 foot fall from a ladder on 12/28/2015 and sustained loss consciousness. He is evaluating the ER and had a negative workup for intracranial and cervical spine abnormalities. X-rays of the shoulder did show a glenoid fracture. No CT scan was performed. He has moderate pain in the left shoulder worse with movement. He also has localized low back pain without radicular symptoms. Vicodin naproxen does not help much. He is not taking any muscle relaxers. Denies any constitutional symptoms. Review of Systems  Constitutional: Negative.   HENT: Negative.   Eyes: Negative.   Respiratory: Negative.   Cardiovascular: Negative.     Gastrointestinal: Negative.   Endocrine: Negative.   Genitourinary: Negative.   Musculoskeletal: Negative.   Skin: Negative.   Allergic/Immunologic: Negative.   Neurological: Negative.   Hematological: Negative.   Psychiatric/Behavioral: Negative.      Objective: Vital Signs: There were no vitals taken for this visit.  Physical Exam  Constitutional: He is oriented to person, place, and time. He appears well-developed and well-nourished.  HENT:  Head: Normocephalic and atraumatic.  Eyes: EOM are normal.  Neck: Neck supple.  Cardiovascular: Intact distal pulses.   Pulmonary/Chest: Effort normal.  Abdominal: Soft.  Neurological: He is alert and oriented to person, place, and time.  Skin: Skin is warm.  Psychiatric: He has a normal mood and affect. His behavior is normal. Judgment and thought content normal.  Nursing note and vitals reviewed.   Ortho Exam Exam of the left shoulder shows no open wounds. He is neurovascularly intact. Range of motion deferred secondary to pain. Specialty Comments:  No specialty comments available.  Imaging: No results found.   PMFS History: Patient Active Problem List   Diagnosis Date Noted  . Glenoid fracture of shoulder, left, closed, initial encounter 12/30/2015  . HTN (hypertension) 12/21/2011  . Insomnia 12/21/2011  . Hematuria 09/28/2011  . General medical examination 11/04/2010  . Erectile  dysfunction 09/02/2010  . DYSPNEA 02/07/2010  . EXERCISE INDUCED BRONCHOSPASM 12/16/2009  . RAYNAUD'S SYNDROME 03/14/2009  . PLEURISY 03/14/2009  . FATIGUE 03/14/2009  . HYPERPOTASSEMIA 02/21/2009  . LATERAL EPICONDYLITIS, RIGHT 08/27/2008  . HYPERLIPIDEMIA 05/28/2008  . HEMATURIA 09/17/2006  . ABNORMAL FINDINGS, ELEVATED BP W/O HTN 09/17/2006   Past Medical History:  Diagnosis Date  . DOE (dyspnea on exertion)    ?hyperventilation syndrome  . Hyperlipidemia   . Hypertension    off meds    Family History  Problem Relation Age of  Onset  . Breast cancer Mother   . Gallbladder disease Mother   . Lymphoma Father     Past Surgical History:  Procedure Laterality Date  . EXCISION METACARPAL MASS Right 11/10/2013   Procedure: RIGHT INDEX AND LONG EXCISION MASS AND DEBRIDEMENT DISTAL INTERPHALANGEAL JOINT ;  Surgeon: Betha LoaKevin Kuzma, MD;  Location: [facility];  Service: Orthopedics;  Laterality: Right;  . TENDON REPAIR  2008   left bicep tendon repair   Social History   Occupational History  . Not on file.   Social History Main Topics  . Smoking status: Never Smoker  . Smokeless tobacco: Never Used  . Alcohol use Yes     Comment: occ.  . Drug use: No  . Sexual activity: Not on file

## 2015-12-31 ENCOUNTER — Ambulatory Visit
Admission: RE | Admit: 2015-12-31 | Discharge: 2015-12-31 | Disposition: A | Discharge status: Home / Self Care | Payer: BLUE CROSS/BLUE SHIELD | Source: Ambulatory Visit | Attending: Orthopaedic Surgery | Admitting: Orthopaedic Surgery

## 2015-12-31 DIAGNOSIS — S42142A Displaced fracture of glenoid cavity of scapula, left shoulder, initial encounter for closed fracture: Secondary | ICD-10-CM

## 2015-12-31 DIAGNOSIS — S42152A Displaced fracture of neck of scapula, left shoulder, initial encounter for closed fracture: Principal | ICD-10-CM

## 2016-01-09 ENCOUNTER — Ambulatory Visit (INDEPENDENT_AMBULATORY_CARE_PROVIDER_SITE_OTHER): Payer: BLUE CROSS/BLUE SHIELD | Admitting: Orthopaedic Surgery

## 2016-01-09 DIAGNOSIS — S42142A Displaced fracture of glenoid cavity of scapula, left shoulder, initial encounter for closed fracture: Secondary | ICD-10-CM

## 2016-01-09 DIAGNOSIS — S42152A Displaced fracture of neck of scapula, left shoulder, initial encounter for closed fracture: Secondary | ICD-10-CM

## 2016-01-09 MED ORDER — CYCLOBENZAPRINE HCL 5 MG PO TABS: 5.0000 mg | ORAL_TABLET | Freq: Three times a day (TID) | ORAL | 3 refills | Status: DC | PRN

## 2016-01-09 NOTE — Progress Notes (Signed)
Office Visit Note   Patient: Duane Kirk           Date of Birth: 07/22/1965           MRN: [id number] Visit Date: 01/09/2016              Requested by: Sheliah HatchKatherine E Tabori, MD [address] [place], [place] [id number] PCP: Neena RhymesKatherine Tabori, MD   Assessment & Plan: Visit Diagnoses:  1. Glenoid fracture of shoulder, left, closed, initial encounter     Plan: CT scan was reviewed with the patient. We will treat this nonoperatively. Patient has no reports of instability. I think he will heal this nicely. I do want him to start pendulum exercises and gentle passive range of motion. Follow up in 3 weeks for clinical recheck again physical therapy and strengthening at that time.  Follow-Up Instructions: Return in about 3 weeks (around 01/30/2016).   Orders:  No orders of the defined types were placed in this encounter.  Meds ordered this encounter  Medications  . DISCONTD: cyclobenzaprine (FLEXERIL) 5 MG tablet    Sig: Take 1-2 tablets (5-10 mg total) by mouth 3 (three) times daily as needed for muscle spasms.    Dispense:  30 tablet    Refill:  3  . cyclobenzaprine (FLEXERIL) 5 MG tablet    Sig: Take 1-2 tablets (5-10 mg total) by mouth 3 (three) times daily as needed for muscle spasms.    Dispense:  30 tablet    Refill:  3      Procedures: No procedures performed   Clinical Data: No additional findings.   Subjective: Chief Complaint  Patient presents with  . Left Shoulder - Pain, Follow-up    Patient follows up today for review of his left glenoid fracture and CT review. He is feeling much better. He still does have pain with movement of the shoulder.    Review of Systems   Objective: Vital Signs: There were no vitals taken for this visit.  Physical Exam  Ortho Exam Exam of the left shoulder shows less bruising and swelling and pain with range of motion. Specialty Comments:  No specialty comments available.  Imaging: No results found.   PMFS  History: Patient Active Problem List   Diagnosis Date Noted  . Glenoid fracture of shoulder, left, closed, initial encounter 12/30/2015  . HTN (hypertension) 12/21/2011  . Insomnia 12/21/2011  . Hematuria 09/28/2011  . General medical examination 11/04/2010  . Erectile dysfunction 09/02/2010  . DYSPNEA 02/07/2010  . EXERCISE INDUCED BRONCHOSPASM 12/16/2009  . RAYNAUD'S SYNDROME 03/14/2009  . PLEURISY 03/14/2009  . FATIGUE 03/14/2009  . HYPERPOTASSEMIA 02/21/2009  . LATERAL EPICONDYLITIS, RIGHT 08/27/2008  . HYPERLIPIDEMIA 05/28/2008  . HEMATURIA 09/17/2006  . ABNORMAL FINDINGS, ELEVATED BP W/O HTN 09/17/2006   Past Medical History:  Diagnosis Date  . DOE (dyspnea on exertion)    ?hyperventilation syndrome  . Hyperlipidemia   . Hypertension    off meds    Family History  Problem Relation Age of Onset  . Breast cancer Mother   . Gallbladder disease Mother   . Lymphoma Father     Past Surgical History:  Procedure Laterality Date  . EXCISION METACARPAL MASS Right 11/10/2013   Procedure: RIGHT INDEX AND LONG EXCISION MASS AND DEBRIDEMENT DISTAL INTERPHALANGEAL JOINT ;  Surgeon: Betha LoaKevin Kuzma, MD;  Location: [facility];  Service: Orthopedics;  Laterality: Right;  . TENDON REPAIR  2008   left bicep tendon repair  Social History   Occupational History  . Not on file.   Social History Main Topics  . Smoking status: Never Smoker  . Smokeless tobacco: Never Used  . Alcohol use Yes     Comment: occ.  . Drug use: No  . Sexual activity: Not on file

## 2016-01-16 ENCOUNTER — Telehealth (INDEPENDENT_AMBULATORY_CARE_PROVIDER_SITE_OTHER): Payer: Self-pay

## 2016-01-16 ENCOUNTER — Encounter (INDEPENDENT_AMBULATORY_CARE_PROVIDER_SITE_OTHER): Payer: Self-pay

## 2016-01-16 NOTE — Telephone Encounter (Signed)
Pt will come by to pick up form (work note)

## 2016-01-30 ENCOUNTER — Ambulatory Visit (INDEPENDENT_AMBULATORY_CARE_PROVIDER_SITE_OTHER): Payer: BLUE CROSS/BLUE SHIELD | Admitting: Orthopaedic Surgery

## 2016-01-30 ENCOUNTER — Encounter (INDEPENDENT_AMBULATORY_CARE_PROVIDER_SITE_OTHER): Payer: Self-pay | Admitting: Orthopaedic Surgery

## 2016-01-30 DIAGNOSIS — S42142A Displaced fracture of glenoid cavity of scapula, left shoulder, initial encounter for closed fracture: Secondary | ICD-10-CM

## 2016-01-30 DIAGNOSIS — S42152A Displaced fracture of neck of scapula, left shoulder, initial encounter for closed fracture: Secondary | ICD-10-CM

## 2016-01-30 NOTE — Progress Notes (Signed)
Patient follows up today 4 weeks status post left glenoid fracture. He is doing much better. He starting to move the shoulder more. He is doing some pendulum exercises. He still out of work. Physical exam is improving. He is able to elevate his arm to just above the level of the shoulder. His rotator cuff is intact. At this point I am going to start him with physical therapy for range of motion strengthening. Continue out of work for at least another 6 weeks. See him back in 6 weeks for recheck.

## 2016-02-10 ENCOUNTER — Ambulatory Visit: Payer: BLUE CROSS/BLUE SHIELD | Attending: Orthopaedic Surgery | Admitting: Physical Therapy

## 2016-02-10 DIAGNOSIS — M6281 Muscle weakness (generalized): Secondary | ICD-10-CM | POA: Diagnosis present

## 2016-02-10 DIAGNOSIS — M25512 Pain in left shoulder: Secondary | ICD-10-CM | POA: Diagnosis not present

## 2016-02-10 DIAGNOSIS — M25612 Stiffness of left shoulder, not elsewhere classified: Secondary | ICD-10-CM | POA: Insufficient documentation

## 2016-02-10 DIAGNOSIS — R293 Abnormal posture: Secondary | ICD-10-CM | POA: Diagnosis present

## 2016-02-10 NOTE — Therapy (Signed)
[facility] [address] [facility] [address]  [address] [place], [place], [address] Phone: [phone]   Fax:  [phone]  Physical Therapy Evaluation  Patient Details  Name: Duane Kirk MRN: [id number] Date of Birth: 10/13/1965 Referring Provider: Dr. Roda Shutters  Encounter Date: 02/10/2016      PT End of Session - 02/10/16 1443    Visit Number 1   Number of Visits 12   Date for PT Re-Evaluation 03/23/16   PT Start Time 1352   PT Stop Time 1427   PT Time Calculation (min) 35 min   Activity Tolerance Patient tolerated treatment well   Behavior During Therapy [facility] for tasks assessed/performed      Past Medical History:  Diagnosis Date  . DOE (dyspnea on exertion)    ?hyperventilation syndrome  . Hyperlipidemia   . Hypertension    off meds    Past Surgical History:  Procedure Laterality Date  . EXCISION METACARPAL MASS Right 11/10/2013   Procedure: RIGHT INDEX AND LONG EXCISION MASS AND DEBRIDEMENT DISTAL INTERPHALANGEAL JOINT ;  Surgeon: Betha Loa, MD;  Location: [facility];  Service: Orthopedics;  Laterality: Right;  . TENDON REPAIR  2008   left bicep tendon repair    There were no vitals filed for this visit.       Subjective Assessment - 02/10/16 1352    Subjective Patient stating fall from ladder - approx 10 feet - 12/28/15. 3 fracture sites throughout glenoid and scapula. Had follow up with MD on 01/30/16 - patient out of sling, has been doing pendulums. Works for [employer] - currently out of work. Pain is minimal, intermittent numbness and tingling into arm - resolves in a couple minutes. Some difficutly with sleeping as he can not sleep on L shoulder. R hand dominant.    Pertinent History HTN, concussion after fall   Limitations Lifting   Patient Stated Goals regain normal function of L shoulder   Currently in Pain? No/denies   Multiple Pain Sites No            [facility] PT Assessment - 02/10/16 1356      Assessment    Medical Diagnosis L glenoid fracture   Referring Provider Dr. Roda Shutters   Onset Date/Surgical Date 12/28/15   Hand Dominance Right   Next MD Visit 03/12/16   Prior Therapy no     Precautions   Precautions None     Balance Screen   Has the patient fallen in the past 6 months Yes   How many times? 1   Has the patient had a decrease in activity level because of a fear of falling?  No   Is the patient reluctant to leave their home because of a fear of falling?  No     Home Tourist information centre manager residence     Prior Function   Level of Independence Independent   Vocation Full time employment  [employer]   Vocation Requirements heavy UE use   Leisure golf, gym, running     Cognition   Overall Cognitive Status Within Functional Limits for tasks assessed     Observation/Other Assessments   Focus on Therapeutic Outcomes (FOTO)  Shoulder: 51 (49% limited, predicted 27% limited)     ROM / Strength   AROM / PROM / Strength AROM;PROM;Strength     AROM   AROM Assessment Site Shoulder   Right/Left Shoulder Right;Left   Right Shoulder Flexion 160 Degrees   Right Shoulder  ABduction 165 Degrees   Right Shoulder Internal Rotation --  functional to mid back   Right Shoulder External Rotation --  functional to approx T1   Left Shoulder Flexion 140 Degrees   Left Shoulder ABduction 125 Degrees   Left Shoulder Internal Rotation --  functional to L iliac crest   Left Shoulder External Rotation --  functional to approx C4     PROM   PROM Assessment Site Shoulder   Right/Left Shoulder Left   Right Shoulder Flexion 160 Degrees   Right Shoulder ABduction 145 Degrees   Right Shoulder Internal Rotation 56 Degrees   Right Shoulder External Rotation 71 Degrees     Strength   Overall Strength Comments subjective reports of pain with L UE strength testing   Strength Assessment Site Shoulder   Right/Left Shoulder Right;Left   Right Shoulder Flexion 4+/5   Right Shoulder ABduction  4+/5   Right Shoulder Internal Rotation 4+/5   Right Shoulder External Rotation 4+/5   Left Shoulder Flexion 3+/5   Left Shoulder ABduction 3+/5   Left Shoulder Internal Rotation 3+/5   Left Shoulder External Rotation 3/5     Palpation   Palpation comment non-tender to light and deep palpation to L shoulder complex.                            PT Education - 02/10/16 1442    Education provided Yes   Education Details exam findings, POC, HEP   Person(s) Educated Patient   Methods Explanation;Demonstration;Handout   Comprehension Verbalized understanding;Returned demonstration             PT Long Term Goals - 02/10/16 1612      PT LONG TERM GOAL #1   Title patient to be independent with advanced HEP (03/23/16)   Status New     PT LONG TERM GOAL #2   Title Patient to improve L shoulder ROM equal to that of R shoulder with no pain (03/23/16)   Status New     PT LONG TERM GOAL #3   Title Patient to improve L shoulder strength to grossly 4+/5 with no pain (03/23/16)   Status New     PT LONG TERM GOAL #4   Title Patient to improve posture to neutral alignment with ability self-correct (03/23/16)   Status New     PT LONG TERM GOAL #5   Title Patient to demonstrate overhead lifting with no pain for return to work and normal exercise regimen (03/23/16)   Status New               Plan - 02/10/16 1443    Clinical Impression Statement Patient is a 51 y/o male presenting to OPPT today for low complexity evaluation s/p glenoid fracture after falling form 10 foot ladder. Patient has recently been seen by MD with allowance to be out of sling and begin PT for ROM and strengthening. Patient today with reduced AROM and strength of L shoulder as well as poor posturing with rounded shoulders and forward head. Patient to benefit form PT to address the above listed deficits to allow for improved functional use of L UE.    Rehab Potential Excellent   PT Frequency 2x /  week   PT Duration 6 weeks   PT Treatment/Interventions ADLs/Self Care Home Management;Cryotherapy;Electrical Stimulation;Iontophoresis 4mg /ml Dexamethasone;Moist Heat;Ultrasound;Therapeutic exercise;Therapeutic activities;Patient/family education;Manual techniques;Passive range of motion;Vasopneumatic Device;Taping;Dry needling   Consulted and Agree with Plan of Care Patient  Patient will benefit from skilled therapeutic intervention in order to improve the following deficits and impairments:  Decreased activity tolerance, Decreased range of motion, Decreased strength, Impaired UE functional use, Pain  Visit Diagnosis: Acute pain of left shoulder - Plan: PT plan of care cert/re-cert  Stiffness of left shoulder, not elsewhere classified - Plan: PT plan of care cert/re-cert  Muscle weakness (generalized) - Plan: PT plan of care cert/re-cert  Abnormal posture - Plan: PT plan of care cert/re-cert     Problem List Patient Active Problem List   Diagnosis Date Noted  . Glenoid fracture of shoulder, left, closed, initial encounter 12/30/2015  . HTN (hypertension) 12/21/2011  . Insomnia 12/21/2011  . Hematuria 09/28/2011  . General medical examination 11/04/2010  . Erectile dysfunction 09/02/2010  . DYSPNEA 02/07/2010  . EXERCISE INDUCED BRONCHOSPASM 12/16/2009  . RAYNAUD'S SYNDROME 03/14/2009  . PLEURISY 03/14/2009  . FATIGUE 03/14/2009  . HYPERPOTASSEMIA 02/21/2009  . LATERAL EPICONDYLITIS, RIGHT 08/27/2008  . HYPERLIPIDEMIA 05/28/2008  . HEMATURIA 09/17/2006  . ABNORMAL FINDINGS, ELEVATED BP W/O HTN 09/17/2006     Kipp LaurenceStephanie R Tommye Lehenbauer, PT, DPT 02/10/16 4:18 PM   [facility] [place] [address]  [address] [place], [place], [address] Phone: [phone]   Fax:  [phone]  Name: Andria MeuseJoseph Krist MRN: [id number] Date of Birth: 04/23/1965

## 2016-02-10 NOTE — Patient Instructions (Signed)
Shoulder: Flexion (Supine)    With hands shoulder width apart, slowly lower dowel to floor behind head. Do not let elbows bend. Keep back flat. Hold __5-10__ seconds. Repeat _15___ times. Do __2-3__ sessions per day. CAUTION: Stretch slowly and gently.    Scapular Retraction (Standing)    With arms at sides, pinch shoulder blades together. Repeat __15__ times per set. Do _2___ sets per session. Hold 5 seconds   Abduction (Eccentric) - Active-Assist (Cane)    Use unaffected arm to push affected arm out to side. Avoid hiking shoulder. Keep palm relaxed. Slowly lower affected arm for 3-5 seconds. _15__ reps per set, __2_ sets per day.     EXTERNAL ROTATION: Standing - Stable: Exercise Band (Active)    Stand, right arm bent to 90, elbow against side, hand forward. Against yellow resistance band, rotate forearm outward, keeping elbow at side. Rotate forearm outward as far as possible. Complete _2__ sets of _15__ repetitions. Perform _2__ sessions per day.   INTERNAL ROTATION: Standing - Stable: Exercise Band (Active)    Stand, right arm bent to 90, elbow against side, forearm out from body. Against yellow resistance band, rotate arm in to body, keeping elbow at side. Complete __2_ sets of _15__ repetitions. Perform __2_ sessions per day.

## 2016-02-12 ENCOUNTER — Ambulatory Visit: Payer: BLUE CROSS/BLUE SHIELD

## 2016-02-12 DIAGNOSIS — M25512 Pain in left shoulder: Secondary | ICD-10-CM

## 2016-02-12 DIAGNOSIS — R293 Abnormal posture: Secondary | ICD-10-CM

## 2016-02-12 DIAGNOSIS — M25612 Stiffness of left shoulder, not elsewhere classified: Secondary | ICD-10-CM

## 2016-02-12 DIAGNOSIS — M6281 Muscle weakness (generalized): Secondary | ICD-10-CM

## 2016-02-12 NOTE — Therapy (Signed)
[facility] [place] [address]  [address] [place], [place], [address] Phone: [phone]   Fax:  [phone]  Physical Therapy Treatment  Patient Details  Name: Duane Kirk MRN: [id number] Date of Birth: 07/06/1965 Referring Provider: Dr. Roda ShuttersXu  Encounter Date: 02/12/2016      PT End of Session - 02/12/16 1420    Visit Number 2   Number of Visits 12   Date for PT Re-Evaluation 03/23/16   PT Start Time 1400   PT Stop Time 1445   PT Time Calculation (min) 45 min   Activity Tolerance Patient tolerated treatment well   Behavior During Therapy [facility] for tasks assessed/performed      Past Medical History:  Diagnosis Date  . DOE (dyspnea on exertion)    ?hyperventilation syndrome  . Hyperlipidemia   . Hypertension    off meds    Past Surgical History:  Procedure Laterality Date  . EXCISION METACARPAL MASS Right 11/10/2013   Procedure: RIGHT INDEX AND LONG EXCISION MASS AND DEBRIDEMENT DISTAL INTERPHALANGEAL JOINT ;  Surgeon: Betha LoaKevin Kuzma, MD;  Location: [facility];  Service: Orthopedics;  Laterality: Right;  . TENDON REPAIR  2008   left bicep tendon repair    There were no vitals filed for this visit.      Subjective Assessment - 02/12/16 1358    Subjective Pt. reporting pain free today however has had some soreness over this week.   Patient Stated Goals regain normal function of L shoulder   Currently in Pain? No/denies   Multiple Pain Sites No            [facility] Adult PT Treatment/Exercise - 02/12/16 1418      Shoulder Exercises: Standing   Horizontal ABduction AROM;Strengthening;Theraband;15 reps   Theraband Level (Shoulder Horizontal ABduction) Level 3 (Green)   External Rotation AROM   Theraband Level (Shoulder External Rotation) Level 2 (Red)   External Rotation Limitations good technique    Extension AROM;Strengthening;15 reps;Both  5" hold   Theraband Level (Shoulder Extension) Level 3  (Green)   Row AROM;15 reps;Theraband  5" hold   Theraband Level (Shoulder Row) Level 3 (Green)   Retraction 15 reps;AROM   Theraband Level (Shoulder Retraction) Level 3 (Green)   Retraction Limitations cues provided for scapular retraction    Other Standing Exercises Standing W row with green TB 3" x 15 reps   Other Standing Exercises Low row and T-row on TRX cable x 10 reps; with cues for scapular squeeze     Shoulder Exercises: ROM/Strengthening   UBE (Upper Arm Bike) UBE: lvl 1.5, 3 min each way    Cybex Row 10 reps  25#    Other ROM/Strengthening Exercises --                PT Education - 02/12/16 1601    Education provided Yes   Education Details Scapular retraction, scapular retraction/extension with green TB issued to pt.    Person(s) Educated Patient   Methods Explanation;Demonstration;Handout   Comprehension Verbalized understanding;Returned demonstration;Need further instruction             PT Long Term Goals - 02/12/16 1421      PT LONG TERM GOAL #1   Title patient to be independent with advanced HEP (03/23/16)   Status On-going     PT LONG TERM GOAL #2   Title Patient to improve L shoulder ROM equal to that of R shoulder with no pain (03/23/16)  Status On-going     PT LONG TERM GOAL #3   Title Patient to improve L shoulder strength to grossly 4+/5 with no pain (03/23/16)   Status On-going     PT LONG TERM GOAL #4   Title Patient to improve posture to neutral alignment with ability self-correct (03/23/16)   Status On-going     PT LONG TERM GOAL #5   Title Patient to demonstrate overhead lifting with no pain for return to work and normal exercise regimen (03/23/16)   Status On-going               Plan - 02/12/16 1421    Clinical Impression Statement Pt. tolerating scapular and shoulder strengthening activities well today without significant L shoulder pain.  Row and row/extension with green TB issued to pt. via handout with pt. returning  demo well.  Pt. reporting daily performance of initial HEP without issue.  Pt. very compliant and motivated with therapy at this point.   PT Treatment/Interventions ADLs/Self Care Home Management;Cryotherapy;Electrical Stimulation;Iontophoresis 4mg /ml Dexamethasone;Moist Heat;Ultrasound;Therapeutic exercise;Therapeutic activities;Patient/family education;Manual techniques;Passive range of motion;Vasopneumatic Device;Taping;Dry needling   PT Next Visit Plan Progress scapular and shoulder strengthening activity per pt. tolerance      Patient will benefit from skilled therapeutic intervention in order to improve the following deficits and impairments:  Decreased activity tolerance, Decreased range of motion, Decreased strength, Impaired UE functional use, Pain  Visit Diagnosis: Acute pain of left shoulder  Stiffness of left shoulder, not elsewhere classified  Muscle weakness (generalized)  Abnormal posture     Problem List Patient Active Problem List   Diagnosis Date Noted  . Glenoid fracture of shoulder, left, closed, initial encounter 12/30/2015  . HTN (hypertension) 12/21/2011  . Insomnia 12/21/2011  . Hematuria 09/28/2011  . General medical examination 11/04/2010  . Erectile dysfunction 09/02/2010  . DYSPNEA 02/07/2010  . EXERCISE INDUCED BRONCHOSPASM 12/16/2009  . RAYNAUD'S SYNDROME 03/14/2009  . PLEURISY 03/14/2009  . FATIGUE 03/14/2009  . HYPERPOTASSEMIA 02/21/2009  . LATERAL EPICONDYLITIS, RIGHT 08/27/2008  . HYPERLIPIDEMIA 05/28/2008  . HEMATURIA 09/17/2006  . ABNORMAL FINDINGS, ELEVATED BP W/O HTN 09/17/2006    Kermit Balo, PTA 02/12/16 4:25 PM  [facility] [address]  [address] [place], [place], [address] Phone: [phone]   Fax:  [phone]  Name: Duane Kirk MRN: [id number] Date of Birth: 04-Nov-1965

## 2016-02-17 ENCOUNTER — Ambulatory Visit: Payer: BLUE CROSS/BLUE SHIELD

## 2016-02-17 DIAGNOSIS — M25612 Stiffness of left shoulder, not elsewhere classified: Secondary | ICD-10-CM

## 2016-02-17 DIAGNOSIS — M6281 Muscle weakness (generalized): Secondary | ICD-10-CM

## 2016-02-17 DIAGNOSIS — M25512 Pain in left shoulder: Secondary | ICD-10-CM | POA: Diagnosis not present

## 2016-02-17 DIAGNOSIS — R293 Abnormal posture: Secondary | ICD-10-CM

## 2016-02-17 NOTE — Therapy (Signed)
[facility] [place] [address]  [address] [place], [place], [address] Phone: [phone]   Fax:  [phone]  Physical Therapy Treatment  Patient Details  Name: Duane MeuseJoseph Kirk MRN: [id number] Date of Birth: 06/19/1965 Referring Provider: Dr. Roda ShuttersXu  Encounter Date: 02/17/2016      PT End of Session - 02/17/16 0849    Visit Number 3   Number of Visits 12   Date for PT Re-Evaluation 03/23/16   PT Start Time 0845   PT Stop Time 0930   PT Time Calculation (min) 45 min   Activity Tolerance Patient tolerated treatment well   Behavior During Therapy [facility] for tasks assessed/performed      Past Medical History:  Diagnosis Date  . DOE (dyspnea on exertion)    ?hyperventilation syndrome  . Hyperlipidemia   . Hypertension    off meds    Past Surgical History:  Procedure Laterality Date  . EXCISION METACARPAL MASS Right 11/10/2013   Procedure: RIGHT INDEX AND LONG EXCISION MASS AND DEBRIDEMENT DISTAL INTERPHALANGEAL JOINT ;  Surgeon: Betha LoaKevin Kuzma, MD;  Location: [facility];  Service: Orthopedics;  Laterality: Right;  . TENDON REPAIR  2008   left bicep tendon repair    There were no vitals filed for this visit.      Subjective Assessment - 02/17/16 0847    Subjective Pt. reporting mild scapular and anterior shoulder muscular soreness following last treatment which quickly subsided over weekend.     Patient Stated Goals regain normal function of L shoulder   Currently in Pain? No/denies   Multiple Pain Sites No            [facility] Adult PT Treatment/Exercise - 02/17/16 0851      Shoulder Exercises: Prone   Extension AROM;10 reps;Weights;Both;Strengthening   Extension Weight (lbs) 2   Extension Limitations I's over green p-ball (65cm)   External Rotation AROM;10 reps;Weights;Both;Strengthening   External Rotation Weight (lbs) 2   External Rotation Limitations W's over green P-ball (65cm)    Horizontal ABduction 1  AROM;Strengthening;Weights;Both;10 reps   Horizontal ABduction 1 Weight (lbs) 2   Horizontal ABduction 1 Limitations T's over green p-ball (65cm)    Other Prone Exercises Y's over green p-ball (65cm) 2# x 10 reps     Shoulder Exercises: Standing   Horizontal ABduction AROM;15 reps;Theraband;Strengthening;Both   Theraband Level (Shoulder Horizontal ABduction) Level 3 (Green)   Horizontal ABduction Limitations 3" hold    External Rotation AROM;15 reps;Left;Theraband;Strengthening   Theraband Level (Shoulder External Rotation) Level 3 (Green)   Internal Rotation AROM;15 reps;Theraband;Strengthening;Left   Extension AROM;Strengthening;15 reps;Both   Theraband Level (Shoulder Extension) Level 4 (Blue)   Row AROM;15 reps;Theraband   Theraband Level (Shoulder Row) Level 4 (Blue)   Other Standing Exercises Low row and T-row on TRX cable x 12 reps; with cues for scapular squeeze     Shoulder Exercises: ROM/Strengthening   UBE (Upper Arm Bike) UBE: lvl 4.0, 3 min each way    Cybex Row 15 reps   Cybex Row Limitations 25#    Other ROM/Strengthening Exercises BATCA push up plus 25# x 20 reps   Other ROM/Strengthening Exercises BATCA pull down 25# x 15 reps     Shoulder Exercises: Stretch   Corner Stretch 2 reps;30 seconds   Corner Stretch Limitations 3-way doorway chest stretch 2 x 30 sec each way             PT Long Term Goals - 02/12/16 1421  PT LONG TERM GOAL #1   Title patient to be independent with advanced HEP (03/23/16)   Status On-going     PT LONG TERM GOAL #2   Title Patient to improve L shoulder ROM equal to that of R shoulder with no pain (03/23/16)   Status On-going     PT LONG TERM GOAL #3   Title Patient to improve L shoulder strength to grossly 4+/5 with no pain (03/23/16)   Status On-going     PT LONG TERM GOAL #4   Title Patient to improve posture to neutral alignment with ability self-correct (03/23/16)   Status On-going     PT LONG TERM GOAL #5   Title  Patient to demonstrate overhead lifting with no pain for return to work and normal exercise regimen (03/23/16)   Status On-going               Plan - 02/17/16 0850    Clinical Impression Statement Today's treatment with progression of scapular and RTC strengthening activity, which was tolerated well.  Pt. still with complaint of mild anterior shoulder, "catching" sensation with some end range extension motions however pain quickly resolves.  Pt. progressing well at this point requesting to start conservative scapular machine strengthening at local gym.  Pt. instructed to perform light machine pull downs, and rows as instructed in therapy and discuss tolerance with therapist next treatment.   PT Treatment/Interventions ADLs/Self Care Home Management;Cryotherapy;Electrical Stimulation;Iontophoresis 4mg /ml Dexamethasone;Moist Heat;Ultrasound;Therapeutic exercise;Therapeutic activities;Patient/family education;Manual techniques;Passive range of motion;Vasopneumatic Device;Taping;Dry needling   PT Next Visit Plan Progress scapular and shoulder strengthening activity per pt. tolerance      Patient will benefit from skilled therapeutic intervention in order to improve the following deficits and impairments:  Decreased activity tolerance, Decreased range of motion, Decreased strength, Impaired UE functional use, Pain  Visit Diagnosis: Acute pain of left shoulder  Stiffness of left shoulder, not elsewhere classified  Muscle weakness (generalized)  Abnormal posture     Problem List Patient Active Problem List   Diagnosis Date Noted  . Glenoid fracture of shoulder, left, closed, initial encounter 12/30/2015  . HTN (hypertension) 12/21/2011  . Insomnia 12/21/2011  . Hematuria 09/28/2011  . General medical examination 11/04/2010  . Erectile dysfunction 09/02/2010  . DYSPNEA 02/07/2010  . EXERCISE INDUCED BRONCHOSPASM 12/16/2009  . RAYNAUD'S SYNDROME 03/14/2009  . PLEURISY 03/14/2009   . FATIGUE 03/14/2009  . HYPERPOTASSEMIA 02/21/2009  . LATERAL EPICONDYLITIS, RIGHT 08/27/2008  . HYPERLIPIDEMIA 05/28/2008  . HEMATURIA 09/17/2006  . ABNORMAL FINDINGS, ELEVATED BP W/O HTN 09/17/2006    Kermit Balo, PTA 02/17/16 12:56 PM  [facility] [address]  [address] [place], [place], [address] Phone: [phone]   Fax:  [phone]  Name: Duane Kirk MRN: [id number] Date of Birth: 05/06/1965

## 2016-02-20 ENCOUNTER — Ambulatory Visit: Payer: BLUE CROSS/BLUE SHIELD | Admitting: Physical Therapy

## 2016-02-24 ENCOUNTER — Ambulatory Visit: Payer: BLUE CROSS/BLUE SHIELD

## 2016-02-24 DIAGNOSIS — R293 Abnormal posture: Secondary | ICD-10-CM

## 2016-02-24 DIAGNOSIS — M25512 Pain in left shoulder: Secondary | ICD-10-CM | POA: Diagnosis not present

## 2016-02-24 DIAGNOSIS — M25612 Stiffness of left shoulder, not elsewhere classified: Secondary | ICD-10-CM

## 2016-02-24 DIAGNOSIS — M6281 Muscle weakness (generalized): Secondary | ICD-10-CM

## 2016-02-24 NOTE — Therapy (Signed)
[facility]: [facility] [place] [address]  [address] [place], [place], [address] Phone: [phone]   Fax:  [phone]  Physical Therapy Treatment  Patient Details  Name: Duane MeuseJoseph Kirk MRN: [id number] Date of Birth: 03/02/1965 Referring Provider: Dr. Roda ShuttersXu  Encounter Date: 02/24/2016      PT End of Session - 02/24/16 0852    Visit Number 4   Number of Visits 12   Date for PT Re-Evaluation 03/23/16   PT Start Time 0843   PT Stop Time 0929   PT Time Calculation (min) 46 min   Activity Tolerance Patient tolerated treatment well   Behavior During Therapy [facility] for tasks assessed/performed      Past Medical History:  Diagnosis Date  . DOE (dyspnea on exertion)    ?hyperventilation syndrome  . Hyperlipidemia   . Hypertension    off meds    Past Surgical History:  Procedure Laterality Date  . EXCISION METACARPAL MASS Right 11/10/2013   Procedure: RIGHT INDEX AND LONG EXCISION MASS AND DEBRIDEMENT DISTAL INTERPHALANGEAL JOINT ;  Surgeon: Betha LoaKevin Kuzma, MD;  Location: [facility];  Service: Orthopedics;  Laterality: Right;  . TENDON REPAIR  2008   left bicep tendon repair    There were no vitals filed for this visit.      Subjective Assessment - 02/24/16 0844    Subjective Pt. reporting he has been at the gym over the weekend attempting light cardio, pulldowns, low rows.  Pt. reporting he is having less frequent tingling/numbness then at start of therapy.     Patient Stated Goals regain normal function of L shoulder   Currently in Pain? No/denies   Multiple Pain Sites No           [facility] Adult PT Treatment/Exercise - 02/24/16 0852      Shoulder Exercises: Supine   Protraction AROM;Both;20 reps;Weights   Protraction Weight (lbs) 10#   Protraction Limitations 2 sets    Horizontal ABduction AROM;Both;15 reps;Theraband  5" hold   Theraband Level (Shoulder Horizontal ABduction) --  black TB   Other Supine Exercises  Supine laying on 1/2 foam bolster with alternating shoulder flexion/extension (X pattern) with black TB x 15 reps each way      Shoulder Exercises: Prone   Extension AROM;Weights;Both;Strengthening;15 reps   Extension Weight (lbs) 2   Extension Limitations I's over green p-ball (65cm)   External Rotation AROM;Weights;Both;Strengthening;15 reps   External Rotation Weight (lbs) 2   External Rotation Limitations W's over green P-ball (65cm)    Horizontal ABduction 1 AROM;Strengthening;Weights;Both;15 reps   Horizontal ABduction 1 Weight (lbs) 2   Horizontal ABduction 1 Limitations T's over green p-ball (65cm)    Other Prone Exercises Y's over green p-ball (65cm) 2# x 10 reps     Shoulder Exercises: Standing   Horizontal ABduction AROM;15 reps;Theraband;Strengthening;Both   Theraband Level (Shoulder Horizontal ABduction) Level 3 (Green)   Horizontal ABduction Limitations 3" hold    External Rotation AROM;15 reps;Left;Theraband;Strengthening   Theraband Level (Shoulder External Rotation) Level 4 (Blue)   Internal Rotation AROM;15 reps;Theraband;Strengthening;Left   Theraband Level (Shoulder Internal Rotation) Level 4 (Blue)   Flexion AROM;10 reps;Both   Shoulder Flexion Weight (lbs) 4   Flexion Limitations leaning on 1/2 foam bolster    Other Standing Exercises Low row and T-row on TRX cable x 15 reps; with cues for scapular squeeze     Shoulder Exercises: ROM/Strengthening   UBE (Upper Arm Bike) UBE: lvl 4.0, 3 min each  way    Cybex Row 20 reps   Cybex Row Limitations 35#   Other ROM/Strengthening Exercises --     Shoulder Exercises: Stretch   Corner Stretch 2 reps;30 seconds   Corner Stretch Limitations 3-way doorway chest stretch 2 x 30 sec each way      Shoulder Exercises: Body Blade   Flexion 45 seconds;1 rep   Flexion Limitations B shoulders above 90 dgs    ABduction 30 seconds;1 rep   ABduction Limitations scaption to 90 dg    External Rotation 45 seconds;1 rep   External  Rotation Limitations neutral    Internal Rotation 45 seconds;1 rep   Internal Rotation Limitations neutral             PT Long Term Goals - 02/12/16 1421      PT LONG TERM GOAL #1   Title patient to be independent with advanced HEP (03/23/16)   Status On-going     PT LONG TERM GOAL #2   Title Patient to improve L shoulder ROM equal to that of R shoulder with no pain (03/23/16)   Status On-going     PT LONG TERM GOAL #3   Title Patient to improve L shoulder strength to grossly 4+/5 with no pain (03/23/16)   Status On-going     PT LONG TERM GOAL #4   Title Patient to improve posture to neutral alignment with ability self-correct (03/23/16)   Status On-going     PT LONG TERM GOAL #5   Title Patient to demonstrate overhead lifting with no pain for return to work and normal exercise regimen (03/23/16)   Status On-going               Plan - 02/24/16 0917    Clinical Impression Statement Pt. able to perform light low row and machine pulldown at gym over weekend without issue however reporting he has not tried any, "push type activities" yet.  Reps progressed today with all L shoulder/scapular strengthening activities and Body Blade dynamic stability added today.  Pt. tolerating all therex well without pain with only, "pulling" sensation noted at anterior shoulder.  Pt. chief complaint at this point is end range extension, and horizontal adduction past midline however noting pain has improved with these motions.  Pt. seems to be progressing well and will continue to benefit from further skilled therapy to improve L UE strength and functional capacity.     PT Treatment/Interventions ADLs/Self Care Home Management;Cryotherapy;Electrical Stimulation;Iontophoresis 4mg /ml Dexamethasone;Moist Heat;Ultrasound;Therapeutic exercise;Therapeutic activities;Patient/family education;Manual techniques;Passive range of motion;Vasopneumatic Device;Taping;Dry needling   PT Next Visit Plan Progress  scapular and shoulder strengthening activity per pt. tolerance      Patient will benefit from skilled therapeutic intervention in order to improve the following deficits and impairments:  Decreased activity tolerance, Decreased range of motion, Decreased strength, Impaired UE functional use, Pain  Visit Diagnosis: Acute pain of left shoulder  Stiffness of left shoulder, not elsewhere classified  Muscle weakness (generalized)  Abnormal posture     Problem List Patient Active Problem List   Diagnosis Date Noted  . Glenoid fracture of shoulder, left, closed, initial encounter 12/30/2015  . HTN (hypertension) 12/21/2011  . Insomnia 12/21/2011  . Hematuria 09/28/2011  . General medical examination 11/04/2010  . Erectile dysfunction 09/02/2010  . DYSPNEA 02/07/2010  . EXERCISE INDUCED BRONCHOSPASM 12/16/2009  . RAYNAUD'S SYNDROME 03/14/2009  . PLEURISY 03/14/2009  . FATIGUE 03/14/2009  . HYPERPOTASSEMIA 02/21/2009  . LATERAL EPICONDYLITIS, RIGHT 08/27/2008  . HYPERLIPIDEMIA 05/28/2008  .  HEMATURIA 09/17/2006  . ABNORMAL FINDINGS, ELEVATED BP W/O HTN 09/17/2006    Kermit Balo, PTA 02/24/16 12:42 PM  [facility] [address]  [address] [place], [place], [address] Phone: [phone]   Fax:  [phone]  Name: Duane Kirk MRN: [id number] Date of Birth: [birth year]

## 2016-02-27 ENCOUNTER — Ambulatory Visit: Payer: BLUE CROSS/BLUE SHIELD | Admitting: Physical Therapy

## 2016-02-27 DIAGNOSIS — M25512 Pain in left shoulder: Secondary | ICD-10-CM | POA: Diagnosis not present

## 2016-02-27 DIAGNOSIS — R293 Abnormal posture: Secondary | ICD-10-CM

## 2016-02-27 DIAGNOSIS — M6281 Muscle weakness (generalized): Secondary | ICD-10-CM

## 2016-02-27 DIAGNOSIS — M25612 Stiffness of left shoulder, not elsewhere classified: Secondary | ICD-10-CM

## 2016-02-27 NOTE — Patient Instructions (Signed)

## 2016-02-27 NOTE — Therapy (Signed)
[facility] [address]  [address] [place], [place], [address] Phone: [phone]   Fax:  [phone]  Physical Therapy Treatment  Patient Details  Name: Duane Kirk MRN: [id number] Date of Birth: [birth year] Referring Provider: Dr. Roda Shutters  Encounter Date: 02/27/2016      PT End of Session - 02/27/16 0840    Visit Number 5   Number of Visits 12   Date for PT Re-Evaluation 03/23/16   PT Start Time 0839   PT Stop Time 0925   PT Time Calculation (min) 46 min   Activity Tolerance Patient tolerated treatment well   Behavior During Therapy [facility] for tasks assessed/performed      Past Medical History:  Diagnosis Date  . DOE (dyspnea on exertion)    ?hyperventilation syndrome  . Hyperlipidemia   . Hypertension    off meds    Past Surgical History:  Procedure Laterality Date  . EXCISION METACARPAL MASS Right 11/10/2013   Procedure: RIGHT INDEX AND LONG EXCISION MASS AND DEBRIDEMENT DISTAL INTERPHALANGEAL JOINT ;  Surgeon: Betha Loa, MD;  Location: [facility];  Service: Orthopedics;  Laterality: Right;  . TENDON REPAIR  2008   left bicep tendon repair    There were no vitals filed for this visit.      Subjective Assessment - 02/27/16 0839    Subjective Patient reporting some pain along posterior shoulder last few days - no known reason for pain   Pertinent History HTN, concussion after fall   Patient Stated Goals regain normal function of L shoulder   Currently in Pain? No/denies   Multiple Pain Sites No                         [facility] Adult PT Treatment/Exercise - 02/27/16 0841      Shoulder Exercises: Supine   Protraction Strengthening;Both;15 reps;Weights   Protraction Weight (lbs) 10   Protraction Limitations x 2 sets     Shoulder Exercises: Prone   Extension Strengthening;Both;15 reps;Weights   Extension Weight (lbs) 3   Extension Limitations I's over green p-ball (65cm)    External Rotation Strengthening;Both;15 reps;Weights   External Rotation Weight (lbs) 3   External Rotation Limitations W's over green P-ball (65cm)    Horizontal ABduction 1 Strengthening;Weights;Both;15 reps   Horizontal ABduction 1 Weight (lbs) 3   Horizontal ABduction 1 Limitations T's over green p-ball (65cm)    Horizontal ABduction 2 Strengthening;Both;15 reps;Weights   Horizontal ABduction 2 Weight (lbs) 3   Horizontal ABduction 2 Limitations Y's over green p-ball   Other Prone Exercises ER over ball - 2.5# x 15     Shoulder Exercises: Standing   External Rotation Strengthening;Left;15 reps;Theraband   Theraband Level (Shoulder External Rotation) Level 3 (Green)   External Rotation Limitations overhead x 2 sets   Internal Rotation Strengthening;Left;15 reps;Theraband   Theraband Level (Shoulder Internal Rotation) Level 3 (Green)   Internal Rotation Limitations overhead x 2 sets   Other Standing Exercises Mid row on TRX x 15; T row on TRX x 15     Shoulder Exercises: ROM/Strengthening   UBE (Upper Arm Bike) Level 4.5 x 6 min (3/3)   Cybex Row 15 reps   Cybex Row Limitations 45#   Rhythmic Stabilization, Supine yellow med ball - CW x 20, CCW x 20, A-Z x 1; therapist resisted 3 x 30 seconds - distal UE     Shoulder Exercises: [employer], [employer]  Stretch 3 reps;30 seconds   Corner Stretch Limitations 3 way - door (low/mid/high)   Other Shoulder Stretches foam roller to posterior capsule     Modalities   Modalities Iontophoresis     Iontophoresis   Type of Iontophoresis Dexamethasone   Location L shoulder   Dose 1.0 mL    Time 80 mA - 4-6 hours                PT Education - 02/27/16 1212    Education provided Yes   Education Details ionto precautions   Person(s) Educated Patient   Methods Explanation;Handout   Comprehension Verbalized understanding;Returned demonstration             PT Long Term Goals - 02/12/16 1421      PT LONG TERM GOAL #1   Title  patient to be independent with advanced HEP (03/23/16)   Status On-going     PT LONG TERM GOAL #2   Title Patient to improve L shoulder ROM equal to that of R shoulder with no pain (03/23/16)   Status On-going     PT LONG TERM GOAL #3   Title Patient to improve L shoulder strength to grossly 4+/5 with no pain (03/23/16)   Status On-going     PT LONG TERM GOAL #4   Title Patient to improve posture to neutral alignment with ability self-correct (03/23/16)   Status On-going     PT LONG TERM GOAL #5   Title Patient to demonstrate overhead lifting with no pain for return to work and normal exercise regimen (03/23/16)   Status On-going               Plan - 02/27/16 0840    Clinical Impression Statement Patient today with some pain of L posterior shoulder - of which he does not know cause. Patient with excellent compliance of with HEP and daily exercise regimen. Patient doing well with all therapeutic ezercise today with no pain increase during treatment session. Ionto initiated today for hopeful reduction in pain. Will continue to benefit from PT to maximize functional use of L UE.    PT Treatment/Interventions ADLs/Self Care Home Management;Cryotherapy;Electrical Stimulation;Iontophoresis 4mg /ml Dexamethasone;Moist Heat;Ultrasound;Therapeutic exercise;Therapeutic activities;Patient/family education;Manual techniques;Passive range of motion;Vasopneumatic Device;Taping;Dry needling   PT Next Visit Plan Progress scapular and shoulder strengthening activity per pt. tolerance   Consulted and Agree with Plan of Care Patient      Patient will benefit from skilled therapeutic intervention in order to improve the following deficits and impairments:  Decreased activity tolerance, Decreased range of motion, Decreased strength, Impaired UE functional use, Pain  Visit Diagnosis: Acute pain of left shoulder  Stiffness of left shoulder, not elsewhere classified  Muscle weakness  (generalized)  Abnormal posture     Problem List Patient Active Problem List   Diagnosis Date Noted  . Glenoid fracture of shoulder, left, closed, initial encounter 12/30/2015  . HTN (hypertension) 12/21/2011  . Insomnia 12/21/2011  . Hematuria 09/28/2011  . General medical examination 11/04/2010  . Erectile dysfunction 09/02/2010  . DYSPNEA 02/07/2010  . EXERCISE INDUCED BRONCHOSPASM 12/16/2009  . RAYNAUD'S SYNDROME 03/14/2009  . PLEURISY 03/14/2009  . FATIGUE 03/14/2009  . HYPERPOTASSEMIA 02/21/2009  . LATERAL EPICONDYLITIS, RIGHT 08/27/2008  . HYPERLIPIDEMIA 05/28/2008  . HEMATURIA 09/17/2006  . ABNORMAL FINDINGS, ELEVATED BP W/O HTN 09/17/2006    Kipp LaurenceStephanie R Taniyah Ballow, PT, DPT 02/27/16 12:19 PM   [facility] [place] [address]  [address] [place],  [place], [address] Phone: [phone]   Fax:  [phone]  Name: Jamaurion Slemmer MRN: [id number] Date of Birth: 11/19/1965

## 2016-03-02 ENCOUNTER — Ambulatory Visit: Payer: BLUE CROSS/BLUE SHIELD

## 2016-03-02 DIAGNOSIS — R293 Abnormal posture: Secondary | ICD-10-CM

## 2016-03-02 DIAGNOSIS — M25512 Pain in left shoulder: Secondary | ICD-10-CM

## 2016-03-02 DIAGNOSIS — M25612 Stiffness of left shoulder, not elsewhere classified: Secondary | ICD-10-CM

## 2016-03-02 DIAGNOSIS — M6281 Muscle weakness (generalized): Secondary | ICD-10-CM

## 2016-03-02 NOTE — Therapy (Signed)
[facility] [address]  [address] [place], [place], [address] Phone: [phone]   Fax:  [phone]  Physical Therapy Treatment  Patient Details  Name: Duane Kirk MRN: [id number] Date of Birth: Jun 30, 1965 Referring Provider: Dr. Roda Shutters  Encounter Date: 03/02/2016      PT End of Session - 03/02/16 0849    Visit Number 6   Number of Visits 12   Date for PT Re-Evaluation 03/23/16   PT Start Time 0847   PT Stop Time 0935   PT Time Calculation (min) 48 min   Activity Tolerance Patient tolerated treatment well   Behavior During Therapy [facility] for tasks assessed/performed      Past Medical History:  Diagnosis Date  . DOE (dyspnea on exertion)    ?hyperventilation syndrome  . Hyperlipidemia   . Hypertension    off meds    Past Surgical History:  Procedure Laterality Date  . EXCISION METACARPAL MASS Right 11/10/2013   Procedure: RIGHT INDEX AND LONG EXCISION MASS AND DEBRIDEMENT DISTAL INTERPHALANGEAL JOINT ;  Surgeon: Betha Loa, MD;  Location: [facility];  Service: Orthopedics;  Laterality: Right;  . TENDON REPAIR  2008   left bicep tendon repair    There were no vitals filed for this visit.      Subjective Assessment - 03/02/16 0849    Subjective Pt. reporting mild benefit from the ionto patch applied last treatment.  Pt. reporting he still has pain with turning stearing wheel in car, across body reaching, putting on/taking off coat.     Patient Stated Goals regain normal function of L shoulder   Currently in Pain? No/denies   Multiple Pain Sites No            [facility] Adult PT Treatment/Exercise - 03/02/16 0847      Shoulder Exercises: Supine   Protraction Strengthening;Both;Weights;20 reps   Protraction Weight (lbs) 10   Protraction Limitations x 2 sets   Other Supine Exercises supine and R sidelying L shoulder circles, CW, CCW with 3000Gr med ball x 30 reps each way     Shoulder Exercises:  Prone   Extension Strengthening;Both;15 reps;Weights   Extension Weight (lbs) 3   Extension Limitations I's over green p-ball (65cm)   External Rotation Strengthening;Both;15 reps;Weights   External Rotation Weight (lbs) 3   External Rotation Limitations W's over green P-ball (65cm)    Horizontal ABduction 1 Strengthening;Weights;Both;15 reps   Horizontal ABduction 1 Weight (lbs) 3   Horizontal ABduction 1 Limitations T's over green p-ball (65cm)    Horizontal ABduction 2 Strengthening;Both;15 reps;Weights   Horizontal ABduction 2 Weight (lbs) 3   Horizontal ABduction 2 Limitations Y's over green p-ball     Shoulder Exercises: Standing   External Rotation Strengthening;Left;Theraband;20 reps   Theraband Level (Shoulder External Rotation) Level 3 (Green)   External Rotation Limitations overhead x 2 sets   Internal Rotation Strengthening;Left;Theraband;20 reps   Theraband Level (Shoulder Internal Rotation) Level 3 (Green)   Internal Rotation Limitations overhead x 2 sets   Other Standing Exercises Mid-row on TRX x 20; T-row on TRX x 20; W-row on TRX x 15 reps     Shoulder Exercises: ROM/Strengthening   UBE (Upper Arm Bike) Level 4.5 x 6 min (3/3)   Cybex Row 10 reps   Cybex Row Limitations 55#   Rhythmic Stabilization, Supine Supine and sidelying yellow med ball - CW x 30, CCW x 30     Shoulder Exercises: Stretch  Corner Stretch 3 reps;30 seconds   Research officer, political party Limitations 3 way - door (low/mid/high)   Internal Rotation Stretch 30 seconds   Internal Rotation Stretch Limitations sleeper stretch in L sidelying   Other Shoulder Stretches L shoulder horizontal adduction cross-body stretch x 30 sec    Other Shoulder Stretches Supine L lats stretch with DKTC heels on peanut p-ball x 1 min      Iontophoresis   Type of Iontophoresis Dexamethasone   Location L shoulder (anterior)   Dose 1.0 mL    Time 80 mA - 4-6 hours           PT Long Term Goals - 02/12/16 1421      PT  LONG TERM GOAL #1   Title patient to be independent with advanced HEP (03/23/16)   Status On-going     PT LONG TERM GOAL #2   Title Patient to improve L shoulder ROM equal to that of R shoulder with no pain (03/23/16)   Status On-going     PT LONG TERM GOAL #3   Title Patient to improve L shoulder strength to grossly 4+/5 with no pain (03/23/16)   Status On-going     PT LONG TERM GOAL #4   Title Patient to improve posture to neutral alignment with ability self-correct (03/23/16)   Status On-going     PT LONG TERM GOAL #5   Title Patient to demonstrate overhead lifting with no pain for return to work and normal exercise regimen (03/23/16)   Status On-going               Plan - 03/02/16 1610    Clinical Impression Statement Pt. still noting pain with cross-body reaching, putting on coat, and picking up dog over weekend.  Pt. still reporting performing rows, pulldowns at gym 4x/wk and consistent HEP performance.  Pt. noting some benefit from ionto patch applied last treatment thus ionto patch 2#/6 applied to anterior shoulder today.  Today's treatment focusing on further progression into overhead strengthening with pt. tolerating this well.  Pt. still noting occasional, "catching" sensation with overhead IR/ER motions however progressed reps without issue today.  Pt. with upcoming MD appointment 2.8.18 and seems to be progressing well at this point.     PT Treatment/Interventions ADLs/Self Care Home Management;Cryotherapy;Electrical Stimulation;Iontophoresis 4mg /ml Dexamethasone;Moist Heat;Ultrasound;Therapeutic exercise;Therapeutic activities;Patient/family education;Manual techniques;Passive range of motion;Vasopneumatic Device;Taping;Dry needling   PT Next Visit Plan Monitor response to ionto patch; Progress scapular and shoulder strengthening activity per pt. tolerance      Patient will benefit from skilled therapeutic intervention in order to improve the following deficits and  impairments:  Decreased activity tolerance, Decreased range of motion, Decreased strength, Impaired UE functional use, Pain  Visit Diagnosis: Acute pain of left shoulder  Stiffness of left shoulder, not elsewhere classified  Muscle weakness (generalized)  Abnormal posture     Problem List Patient Active Problem List   Diagnosis Date Noted  . Glenoid fracture of shoulder, left, closed, initial encounter 12/30/2015  . HTN (hypertension) 12/21/2011  . Insomnia 12/21/2011  . Hematuria 09/28/2011  . General medical examination 11/04/2010  . Erectile dysfunction 09/02/2010  . DYSPNEA 02/07/2010  . EXERCISE INDUCED BRONCHOSPASM 12/16/2009  . RAYNAUD'S SYNDROME 03/14/2009  . PLEURISY 03/14/2009  . FATIGUE 03/14/2009  . HYPERPOTASSEMIA 02/21/2009  . LATERAL EPICONDYLITIS, RIGHT 08/27/2008  . HYPERLIPIDEMIA 05/28/2008  . HEMATURIA 09/17/2006  . ABNORMAL FINDINGS, ELEVATED BP W/O HTN 09/17/2006    Kermit Balo, PTA 03/02/16 9:52 AM  [facility] [address]  [address] [place], [place], [address] Phone: [phone]   Fax:  [phone]  Name: Duane Kirk MRN: [id number] Date of Birth: 04/29/1965

## 2016-03-05 ENCOUNTER — Ambulatory Visit: Payer: BLUE CROSS/BLUE SHIELD | Attending: Orthopaedic Surgery | Admitting: Physical Therapy

## 2016-03-05 DIAGNOSIS — M25612 Stiffness of left shoulder, not elsewhere classified: Secondary | ICD-10-CM

## 2016-03-05 DIAGNOSIS — M25512 Pain in left shoulder: Secondary | ICD-10-CM | POA: Insufficient documentation

## 2016-03-05 DIAGNOSIS — R293 Abnormal posture: Secondary | ICD-10-CM | POA: Diagnosis present

## 2016-03-05 DIAGNOSIS — M6281 Muscle weakness (generalized): Secondary | ICD-10-CM | POA: Insufficient documentation

## 2016-03-05 NOTE — Patient Instructions (Signed)
(  Home) Internal Rotation: in Abduction - Sitting    Standing - blue band in door - rotate towards ground   (Home) External Rotation: in Abduction - Sitting    Standing - blue band in door - rotating backwards

## 2016-03-05 NOTE — Therapy (Signed)
[facility] [address]  [address] [place], [place] [facility], [address] Phone: [phone]   Fax:  [phone]  Physical Therapy Treatment  Patient Details  Name: Duane MeuseJoseph Kirk MRN: [id number] Date of Birth: 06/18/1965 Referring Provider: Dr. Roda ShuttersXu  Encounter Date: 03/05/2016      PT End of Session - 03/05/16 0837    Visit Number 7   Number of Visits 12   Date for PT Re-Evaluation 03/23/16   PT Start Time 0837   PT Stop Time 0920   PT Time Calculation (min) 43 min   Activity Tolerance Patient tolerated treatment well   Behavior [facility] for tasks assessed/performed      Past Medical History:  Diagnosis Date  . DOE (dyspnea on exertion)    ?hyperventilation syndrome  . Hyperlipidemia   . Hypertension    off meds    Past Surgical History:  Procedure Laterality Date  . EXCISION METACARPAL MASS Right 11/10/2013   Procedure: RIGHT INDEX AND LONG EXCISION MASS AND DEBRIDEMENT DISTAL INTERPHALANGEAL JOINT ;  Surgeon: Betha LoaKevin Kuzma, MD;  Location: [facility];  Service: Orthopedics;  Laterality: Right;  . TENDON REPAIR  2008   left bicep tendon repair    There were no vitals filed for this visit.      Subjective Assessment - 03/05/16 0837    Subjective patient feeling wel - no pain out the ordinary per patient   Pertinent History HTN, concussion after fall   Patient Stated Goals regain normal function of L shoulder   Currently in Pain? No/denies   Multiple Pain Sites No                         [facility] Adult PT Treatment/Exercise - 03/05/16 0001      Shoulder Exercises: Supine   Protraction Strengthening;Both;15 reps;Weights   Protraction Weight (lbs) 10     Shoulder Exercises: Seated   Other Seated Exercises pronation/supination - arm extended forward - 3# 10 reps x 2     Shoulder Exercises: Prone   Extension Strengthening;Both;15 reps;Weights   Extension Weight (lbs) 3   Extension Limitations I's over green p-ball (65cm)   Horizontal ABduction 1 Strengthening;Weights;Both;15 reps   Horizontal ABduction 1 Weight (lbs) 3   Horizontal ABduction 1 Limitations T's over green p-ball (65cm)    Horizontal ABduction 2 Strengthening;Both;15 reps;Weights   Horizontal ABduction 2 Weight (lbs) 3   Horizontal ABduction 2 Limitations Y's over green p-ball   Other Prone Exercises ER over ball - 3# x 15     Shoulder Exercises: Standing   External Rotation Strengthening;Left;15 reps;Theraband   Theraband Level (Shoulder External Rotation) Level 4 (Blue)   External Rotation Limitations overhead x 2 sets   Internal Rotation Strengthening;Left;15 reps;Theraband   Theraband Level (Shoulder Internal Rotation) Level 4 (Blue)   Internal Rotation Limitations overhead x 2 sets   Other Standing Exercises overhead wall taps with ball x 2 min - little fatigue   Other Standing Exercises PNF D2 flexion/extension - blue tband x 15 reps     Shoulder Exercises: ROM/Strengthening   UBE (Upper Arm Bike) level 5 x 6 minutes (3/3)   Cybex Row 15 reps   Cybex Row Limitations 55#   Rhythmic Stabilization, Supine blue med ball - CW x 20, CCW x 20, A-Z; therapist resisted 5 x 30 seconds  PT Long Term Goals - 02/12/16 1421      PT LONG TERM GOAL #1   Title patient to be independent with advanced HEP (03/23/16)   Status On-going     PT LONG TERM GOAL #2   Title Patient to improve L shoulder ROM equal to that of R shoulder with no pain (03/23/16)   Status On-going     PT LONG TERM GOAL #3   Title Patient to improve L shoulder strength to grossly 4+/5 with no pain (03/23/16)   Status On-going     PT LONG TERM GOAL #4   Title Patient to improve posture to neutral alignment with ability self-correct (03/23/16)   Status On-going     PT LONG TERM GOAL #5   Title Patient to demonstrate overhead lifting with no pain for return to work and normal exercise regimen  (03/23/16)   Status On-going               Plan - 03/05/16 0921    Clinical Impression Statement Duane Kirk doing well today. Some continued reports of light pain with driving as well as with quick movements. Patient with good overhead endurance today with little fatigue as well as improved periscapular and overhead ER/IR strength. Patient to continue to benefit from skilled PT intervention to progress functional use of L UE for full return to work.    PT Treatment/Interventions ADLs/Self Care Home Management;Cryotherapy;Electrical Stimulation;Iontophoresis 4mg /ml Dexamethasone;Moist Heat;Ultrasound;Therapeutic exercise;Therapeutic activities;Patient/family education;Manual techniques;Passive range of motion;Vasopneumatic Device;Taping;Dry needling   PT Next Visit Plan Progress scapular and shoulder strengthening activity per pt. tolerance   Consulted and Agree with Plan of Care Patient      Patient will benefit from skilled therapeutic intervention in order to improve the following deficits and impairments:  Decreased activity tolerance, Decreased range of motion, Decreased strength, Impaired UE functional use, Pain  Visit Diagnosis: Acute pain of left shoulder  Stiffness of left shoulder, not elsewhere classified  Muscle weakness (generalized)  Abnormal posture     Problem List Patient Active Problem List   Diagnosis Date Noted  . Glenoid fracture of shoulder, left, closed, initial encounter 12/30/2015  . HTN (hypertension) 12/21/2011  . Insomnia 12/21/2011  . Hematuria 09/28/2011  . General medical examination 11/04/2010  . Erectile dysfunction 09/02/2010  . DYSPNEA 02/07/2010  . EXERCISE INDUCED BRONCHOSPASM 12/16/2009  . RAYNAUD'S SYNDROME 03/14/2009  . PLEURISY 03/14/2009  . FATIGUE 03/14/2009  . HYPERPOTASSEMIA 02/21/2009  . LATERAL EPICONDYLITIS, RIGHT 08/27/2008  . HYPERLIPIDEMIA 05/28/2008  . HEMATURIA 09/17/2006  . ABNORMAL FINDINGS, ELEVATED BP W/O HTN  09/17/2006     Kipp Laurence, PT, DPT 03/05/16 10:58 AM   [facility] [address]  [address] [place], [place], [address] Phone: [phone]   Fax:  [phone]  Name: Duane Kirk MRN: [id number] Date of Birth: [birth year]

## 2016-03-09 ENCOUNTER — Ambulatory Visit: Payer: BLUE CROSS/BLUE SHIELD | Admitting: Physical Therapy

## 2016-03-09 DIAGNOSIS — M25512 Pain in left shoulder: Secondary | ICD-10-CM

## 2016-03-09 DIAGNOSIS — M25612 Stiffness of left shoulder, not elsewhere classified: Secondary | ICD-10-CM

## 2016-03-09 DIAGNOSIS — M6281 Muscle weakness (generalized): Secondary | ICD-10-CM

## 2016-03-09 DIAGNOSIS — R293 Abnormal posture: Secondary | ICD-10-CM

## 2016-03-09 NOTE — Therapy (Signed)
[facility] [address]  [address] [place], [place], [address] Phone: [phone]   Fax:  [phone]  Physical Therapy Treatment  Patient Details  Name: Duane Kirk MRN: [id number] Date of Birth: [birth year] Referring Provider: Dr. Roda Shutters  Encounter Date: 03/09/2016      PT End of Session - 03/09/16 0843    Visit Number 8   Number of Visits 12   Date for PT Re-Evaluation 03/23/16   PT Start Time 0842   PT Stop Time 0925   PT Time Calculation (min) 43 min   Activity Tolerance Patient tolerated treatment well   Behavior During Therapy [facility] for tasks assessed/performed      Past Medical History:  Diagnosis Date  . DOE (dyspnea on exertion)    ?hyperventilation syndrome  . Hyperlipidemia   . Hypertension    off meds    Past Surgical History:  Procedure Laterality Date  . EXCISION METACARPAL MASS Right 11/10/2013   Procedure: RIGHT INDEX AND LONG EXCISION MASS AND DEBRIDEMENT DISTAL INTERPHALANGEAL JOINT ;  Surgeon: Betha Loa, MD;  Location: [facility];  Service: Orthopedics;  Laterality: Right;  . TENDON REPAIR  2008   left bicep tendon repair    There were no vitals filed for this visit.      Subjective Assessment - 03/09/16 0842    Subjective Feeling well - continues to have some pain   Pertinent History HTN, concussion after fall   Patient Stated Goals regain normal function of L shoulder   Currently in Pain? No/denies   Multiple Pain Sites No                         [facility] Adult PT Treatment/Exercise - 03/09/16 0001      Shoulder Exercises: Supine   Protraction Strengthening;Both;20 reps;Weights   Protraction Weight (lbs) 10     Shoulder Exercises: Seated   Other Seated Exercises pronation/supination - arm extended forward - 3# x 15 reps     Shoulder Exercises: Prone   Extension Strengthening;Both;15 reps;Weights   Extension Weight (lbs) 4   Extension Limitations I's  over green p-ball (65cm)   Horizontal ABduction 1 Strengthening;Weights;Both;15 reps   Horizontal ABduction 1 Weight (lbs) 4   Horizontal ABduction 1 Limitations T's over green p-ball (65cm)    Horizontal ABduction 2 Strengthening;Both;15 reps;Weights   Horizontal ABduction 2 Weight (lbs) 4   Horizontal ABduction 2 Limitations Y's over green p-ball   Other Prone Exercises plank - hands and feet 3 x 30 seconds     Shoulder Exercises: Standing   External Rotation Strengthening;Left;15 reps;Theraband   Theraband Level (Shoulder External Rotation) Level 4 (Blue)   External Rotation Limitations overhead x 2 sets   Internal Rotation Strengthening;Left;15 reps;Theraband   Theraband Level (Shoulder Internal Rotation) Level 4 (Blue)   Internal Rotation Limitations overhead x 2 sets   Row Strengthening;Both;15 reps   Row Limitations TRX   Other Standing Exercises PNF D1/D2 flexion/extension - green tband x 15 reps each     Shoulder Exercises: ROM/Strengthening   UBE (Upper Arm Bike) level 5 x 6 minutes (3/3)   Cybex Row 15 reps   Cybex Row Limitations 55#   Rhythmic Stabilization, Supine blue med ball - CW x 30, CCW x 30, A-Z; therapist resisted 5 x 30 seconds   Other ROM/Strengthening Exercises BATCA pull down - 35# x 15 reps      Shoulder Exercises: Stretch  Corner Stretch Limitations HEP review                     PT Long Term Goals - 02/12/16 1421      PT LONG TERM GOAL #1   Title patient to be independent with advanced HEP (03/23/16)   Status On-going     PT LONG TERM GOAL #2   Title Patient to improve L shoulder ROM equal to that of R shoulder with no pain (03/23/16)   Status On-going     PT LONG TERM GOAL #3   Title Patient to improve L shoulder strength to grossly 4+/5 with no pain (03/23/16)   Status On-going     PT LONG TERM GOAL #4   Title Patient to improve posture to neutral alignment with ability self-correct (03/23/16)   Status On-going     PT LONG  TERM GOAL #5   Title Patient to demonstrate overhead lifting with no pain for return to work and normal exercise regimen (03/23/16)   Status On-going               Plan - 03/09/16 0844    Clinical Impression Statement Patient doing well - some continued subective reports of L shoulder pain with rapid movements, but no pain with daily activities. Patient making good progress with overhead ER?IR as well as PNF patterns with added resistance. Will plan to begin working on proper lifting mechanincs for lifting floor to shoulder height at next visit to begin to prepare patient for work related duties. Patient to continue to benefit from PT to maximize functional use of L UE and for return to work at full capacity.    PT Treatment/Interventions ADLs/Self Care Home Management;Cryotherapy;Electrical Stimulation;Iontophoresis 4mg /ml Dexamethasone;Moist Heat;Ultrasound;Therapeutic exercise;Therapeutic activities;Patient/family education;Manual techniques;Passive range of motion;Vasopneumatic Device;Taping;Dry needling   PT Next Visit Plan MD follow up on 03/13/16; begin lifting floor to shoulder height   Consulted and Agree with Plan of Care Patient      Patient will benefit from skilled therapeutic intervention in order to improve the following deficits and impairments:  Decreased activity tolerance, Decreased range of motion, Decreased strength, Impaired UE functional use, Pain  Visit Diagnosis: Acute pain of left shoulder  Stiffness of left shoulder, not elsewhere classified  Muscle weakness (generalized)  Abnormal posture     Problem List Patient Active Problem List   Diagnosis Date Noted  . Glenoid fracture of shoulder, left, closed, initial encounter 12/30/2015  . HTN (hypertension) 12/21/2011  . Insomnia 12/21/2011  . Hematuria 09/28/2011  . General medical examination 11/04/2010  . Erectile dysfunction 09/02/2010  . DYSPNEA 02/07/2010  . EXERCISE INDUCED BRONCHOSPASM  12/16/2009  . RAYNAUD'S SYNDROME 03/14/2009  . PLEURISY 03/14/2009  . FATIGUE 03/14/2009  . HYPERPOTASSEMIA 02/21/2009  . LATERAL EPICONDYLITIS, RIGHT 08/27/2008  . HYPERLIPIDEMIA 05/28/2008  . HEMATURIA 09/17/2006  . ABNORMAL FINDINGS, ELEVATED BP W/O HTN 09/17/2006    Kipp LaurenceStephanie R Consuelo Suthers, PT, DPT 03/09/16 9:50 AM   [facility] [address]  [address] [place], [place], [address] Phone: [phone]   Fax:  [phone]  Name: Duane Kirk MRN: [id number] Date of Birth: 09/05/1965

## 2016-03-12 ENCOUNTER — Ambulatory Visit: Payer: BLUE CROSS/BLUE SHIELD | Admitting: Physical Therapy

## 2016-03-12 ENCOUNTER — Ambulatory Visit (INDEPENDENT_AMBULATORY_CARE_PROVIDER_SITE_OTHER): Payer: BLUE CROSS/BLUE SHIELD | Admitting: Orthopaedic Surgery

## 2016-03-12 DIAGNOSIS — R293 Abnormal posture: Secondary | ICD-10-CM

## 2016-03-12 DIAGNOSIS — M25512 Pain in left shoulder: Secondary | ICD-10-CM

## 2016-03-12 DIAGNOSIS — M25612 Stiffness of left shoulder, not elsewhere classified: Secondary | ICD-10-CM

## 2016-03-12 DIAGNOSIS — M6281 Muscle weakness (generalized): Secondary | ICD-10-CM

## 2016-03-12 NOTE — Therapy (Signed)
[facility] [address]  [address] [place], [place], [address] Phone: [phone]   Fax:  [phone]  Physical Therapy Treatment  Patient Details  Name: Duane MeuseJoseph Kirk MRN: [id number] Date of Birth: 01/23/1966 Referring Provider: Dr. Roda ShuttersXu  Encounter Date: 03/12/2016      PT End of Session - 03/12/16 0851    Visit Number 9   Number of Visits 12   Date for PT Re-Evaluation 03/23/16   PT Start Time 0846   PT Stop Time 0931   PT Time Calculation (min) 45 min   Activity Tolerance Patient tolerated treatment well   Behavior During Therapy [facility] for tasks assessed/performed      Past Medical History:  Diagnosis Date  . DOE (dyspnea on exertion)    ?hyperventilation syndrome  . Hyperlipidemia   . Hypertension    off meds    Past Surgical History:  Procedure Laterality Date  . EXCISION METACARPAL MASS Right 11/10/2013   Procedure: RIGHT INDEX AND LONG EXCISION MASS AND DEBRIDEMENT DISTAL INTERPHALANGEAL JOINT ;  Surgeon: Betha LoaKevin Kuzma, MD;  Location: [facility];  Service: Orthopedics;  Laterality: Right;  . TENDON REPAIR  2008   left bicep tendon repair    There were no vitals filed for this visit.      Subjective Assessment - 03/12/16 0849    Subjective feeling well - no pain   Patient Stated Goals regain normal function of L shoulder   Currently in Pain? No/denies   Multiple Pain Sites No            [facility] PT Assessment - 03/12/16 0001      Assessment   Medical Diagnosis L glenoid fracture   Referring Provider Dr. Roda ShuttersXu   Onset Date/Surgical Date 12/28/15   Hand Dominance Right   Next MD Visit 03/13/16     AROM   Overall AROM  Within functional limits for tasks performed  B UE equal with no pain - patient reporitng slight catching     Strength   Left Shoulder Flexion 4+/5   Left Shoulder ABduction 4+/5   Left Shoulder Internal Rotation 4/5   Left Shoulder External Rotation 4-/5                      [facility] Adult PT Treatment/Exercise - 03/12/16 0851      Shoulder Exercises: Seated   Other Seated Exercises pronation/supination - arm extended forward - 4# x 15 reps     Shoulder Exercises: Prone   Extension Strengthening;Both;15 reps;Weights   Extension Weight (lbs) 4   Extension Limitations I's over green p-ball (65cm)   Horizontal ABduction 1 Strengthening;Weights;Both;15 reps   Horizontal ABduction 1 Weight (lbs) 4   Horizontal ABduction 1 Limitations T's over green p-ball (65cm)    Horizontal ABduction 2 Strengthening;Both;15 reps;Weights   Horizontal ABduction 2 Weight (lbs) 4   Horizontal ABduction 2 Limitations Y's over green p-ball   Other Prone Exercises plank - hands and feet 3 x 30 seconds; side plank L side 3 x 30 sec     Shoulder Exercises: Standing   External Rotation Strengthening;Left;15 reps;Theraband   Theraband Level (Shoulder External Rotation) Level 4 (Blue)   External Rotation Limitations overhead x 2 sets   Internal Rotation Strengthening;Left;15 reps;Theraband   Theraband Level (Shoulder Internal Rotation) Level 4 (Blue)   Internal Rotation Limitations overhead x 2 sets   Row Strengthening;Both;15 reps   Row Limitations TRX   Other  Standing Exercises lifting box with 10# - floor to shoulder height x 15 reps   Other Standing Exercises PNF D1/D2 flexion/extension - blue tband x 15 reps each     Shoulder Exercises: ROM/Strengthening   UBE (Upper Arm Bike) level 5 x 6 minutes (3/3)   Rhythmic Stabilization, Supine PT resisted - distal forearm/wrist - 5 x 30 seconds - much improved   Other ROM/Strengthening Exercises BATCA push up plus 25# x 20 reps   Other ROM/Strengthening Exercises BATCA pull down - 45# x 15 reps      Shoulder Exercises: Stretch   Corner Stretch 2 reps;30 seconds   Corner Stretch Limitations 3 way door - low/mid/high                     PT Long Term Goals - 03/12/16 1319      PT LONG TERM  GOAL #1   Title patient to be independent with advanced HEP (03/23/16)   Status On-going     PT LONG TERM GOAL #2   Title Patient to improve L shoulder ROM equal to that of R shoulder with no pain (03/23/16)   Status Achieved     PT LONG TERM GOAL #3   Title Patient to improve L shoulder strength to grossly 4+/5 with no pain (03/23/16)   Status On-going     PT LONG TERM GOAL #4   Title Patient to improve posture to neutral alignment with ability self-correct (03/23/16)   Status Achieved     PT LONG TERM GOAL #5   Title Patient to demonstrate overhead lifting with no pain for return to work and normal exercise regimen (03/23/16)   Status On-going               Plan - 03/12/16 1610    Clinical Impression Statement Duane Kirk doing well - no pain with therapeutic exercise with good compliance of HEP. PT session today incorporating more work related tasks such as lifitng objects from floor to shoulder height. Patient able to perform with no pain, only subjective reports of slight tightness and catching when going above 90 degrees of forward flexion. Patient and PT discussion today regarding completing plan of care as outlined up until return to work to fully allow for improved strength and function of L UE with reduced risk for re-injury. Patient to continue to beneift from PT to maximize function.    PT Treatment/Interventions ADLs/Self Care Home Management;Cryotherapy;Electrical Stimulation;Iontophoresis 4mg /ml Dexamethasone;Moist Heat;Ultrasound;Therapeutic exercise;Therapeutic activities;Patient/family education;Manual techniques;Passive range of motion;Vasopneumatic Device;Taping;Dry needling   Consulted and Agree with Plan of Care Patient      Patient will benefit from skilled therapeutic intervention in order to improve the following deficits and impairments:  Decreased activity tolerance, Decreased range of motion, Decreased strength, Impaired UE functional use, Pain  Visit  Diagnosis: Acute pain of left shoulder  Stiffness of left shoulder, not elsewhere classified  Muscle weakness (generalized)  Abnormal posture     Problem List Patient Active Problem List   Diagnosis Date Noted  . Glenoid fracture of shoulder, left, closed, initial encounter 12/30/2015  . HTN (hypertension) 12/21/2011  . Insomnia 12/21/2011  . Hematuria 09/28/2011  . General medical examination 11/04/2010  . Erectile dysfunction 09/02/2010  . DYSPNEA 02/07/2010  . EXERCISE INDUCED BRONCHOSPASM 12/16/2009  . RAYNAUD'S SYNDROME 03/14/2009  . PLEURISY 03/14/2009  . FATIGUE 03/14/2009  . HYPERPOTASSEMIA 02/21/2009  . LATERAL EPICONDYLITIS, RIGHT 08/27/2008  . HYPERLIPIDEMIA 05/28/2008  . HEMATURIA 09/17/2006  . ABNORMAL FINDINGS,  ELEVATED BP W/O HTN 09/17/2006    Kipp Laurence, PT, DPT 03/12/16 1:32 PM   [facility] [address]  [address] [place], [place], [address] Phone: [phone]   Fax:  [phone]  Name: Duane Kirk MRN: [id number] Date of Birth: 06/06/1965

## 2016-03-13 ENCOUNTER — Ambulatory Visit (HOSPITAL_BASED_OUTPATIENT_CLINIC_OR_DEPARTMENT_OTHER)
Admission: RE | Admit: 2016-03-13 | Discharge: 2016-03-13 | Disposition: A | Discharge status: Home / Self Care | Payer: BLUE CROSS/BLUE SHIELD | Source: Ambulatory Visit | Attending: Orthopaedic Surgery | Admitting: Orthopaedic Surgery

## 2016-03-13 ENCOUNTER — Ambulatory Visit (INDEPENDENT_AMBULATORY_CARE_PROVIDER_SITE_OTHER): Payer: BLUE CROSS/BLUE SHIELD | Admitting: Orthopaedic Surgery

## 2016-03-13 DIAGNOSIS — S42152A Displaced fracture of neck of scapula, left shoulder, initial encounter for closed fracture: Secondary | ICD-10-CM | POA: Diagnosis present

## 2016-03-13 DIAGNOSIS — M25512 Pain in left shoulder: Secondary | ICD-10-CM | POA: Diagnosis not present

## 2016-03-13 DIAGNOSIS — S42142A Displaced fracture of glenoid cavity of scapula, left shoulder, initial encounter for closed fracture: Secondary | ICD-10-CM | POA: Diagnosis present

## 2016-03-13 NOTE — Progress Notes (Signed)
Patient follows up today for left scapula/glenoid fracture.  He's progressing with PT and strength.  Exam is greatly improved with strength and ROM.  Continue PT for 2 more weeks then HEP.  May return to work on 04/06/16 without restriction.  F/u prn.

## 2016-03-16 ENCOUNTER — Ambulatory Visit: Payer: BLUE CROSS/BLUE SHIELD

## 2016-03-16 DIAGNOSIS — M6281 Muscle weakness (generalized): Secondary | ICD-10-CM

## 2016-03-16 DIAGNOSIS — M25512 Pain in left shoulder: Secondary | ICD-10-CM

## 2016-03-16 DIAGNOSIS — M25612 Stiffness of left shoulder, not elsewhere classified: Secondary | ICD-10-CM

## 2016-03-16 DIAGNOSIS — R293 Abnormal posture: Secondary | ICD-10-CM

## 2016-03-16 NOTE — Therapy (Signed)
[facility] [address]  [address] [place], [place], [address] Phone: [phone]   Fax:  [phone]  Physical Therapy Treatment  Patient Details  Name: Duane Kirk MRN: [id number] Date of Birth: 10-12-1965 Referring Provider: Dr. Roda Shutters  Encounter Date: 03/16/2016      PT End of Session - 03/16/16 0848    Visit Number 10   Number of Visits 12   Date for PT Re-Evaluation 03/23/16   PT Start Time 0846   PT Stop Time 0926   PT Time Calculation (min) 40 min   Activity Tolerance Patient tolerated treatment well   Behavior During Therapy [facility] for tasks assessed/performed      Past Medical History:  Diagnosis Date  . DOE (dyspnea on exertion)    ?hyperventilation syndrome  . Hyperlipidemia   . Hypertension    off meds    Past Surgical History:  Procedure Laterality Date  . EXCISION METACARPAL MASS Right 11/10/2013   Procedure: RIGHT INDEX AND LONG EXCISION MASS AND DEBRIDEMENT DISTAL INTERPHALANGEAL JOINT ;  Surgeon: Betha Loa, MD;  [facility];  Service: Orthopedics;  Laterality: Right;  . TENDON REPAIR  2008   left bicep tendon repair    There were no vitals filed for this visit.      Subjective Assessment - 03/16/16 0847    Subjective Pt. reporting he has been going to the gym performing pulldowns, rows without issue.  Pt. reporting mild soreness following last visit.  Pt. reporting MD f/u on 2.9.18 went well with MD wanting 2 more weeks of PT.     Patient Stated Goals regain normal function of L shoulder   Currently in Pain? No/denies   Multiple Pain Sites No            [facility] Adult PT Treatment/Exercise - 03/16/16 0855      Self-Care   Self-Care Lifting   Lifting Lifting ~ 30# box from floor to just above shoulder height x 10 reps; cues provided to avoid rotation; slight pulling in L shoulder reporting with arms extended over shoulder height     Shoulder Exercises: Supine   Protraction Strengthening;Both;Weights  x 30 reps   Protraction Weight (lbs) 10   Protraction Limitations 1 sets     Shoulder Exercises: Prone   Extension Strengthening;Both;15 reps;Weights   Extension Weight (lbs) 4   Extension Limitations I's over green p-ball (65cm)  3" hold   External Rotation Weight (lbs) 4   External Rotation Limitations W's over green P-ball (65cm)    Internal Rotation Weight (lbs) 4   Horizontal ABduction 1 Strengthening;Weights;Both;15 reps   Horizontal ABduction 1 Weight (lbs) 4   Horizontal ABduction 1 Limitations T's over green p-ball (65cm)   3" hold    Horizontal ABduction 2 Strengthening;Both;15 reps;Weights   Horizontal ABduction 2 Weight (lbs) 4   Horizontal ABduction 2 Limitations Y's over green p-ball  3" hold    Other Prone Exercises plank - hands and feet 2 x 35 seconds; side plank L side 2 x 35 sec     Shoulder Exercises: Standing   External Rotation Strengthening;Left;15 reps;Theraband   Theraband Level (Shoulder External Rotation) Other (comment)  black TB    External Rotation Limitations overhead x 2 sets   Internal Rotation Strengthening;Left;15 reps;Theraband   Theraband Level (Shoulder Internal Rotation) Other (comment)  black TB   Internal Rotation Limitations overhead x 2 sets   Row Strengthening;Both;15 reps   Row Limitations TRX  Shoulder Exercises: Stretch   Corner Stretch 2 reps;30 seconds   Corner Stretch Limitations 3 way door - low/mid/high     Shoulder Exercises: Body Blade   Flexion 30 seconds  B UE 90 dg flexion > ~170dg              PT Long Term Goals - 03/12/16 1319      PT LONG TERM GOAL #1   Title patient to be independent with advanced HEP (03/23/16)   Status On-going     PT LONG TERM GOAL #2   Title Patient to improve L shoulder ROM equal to that of R shoulder with no pain (03/23/16)   Status Achieved     PT LONG TERM GOAL #3   Title Patient to improve L shoulder strength to grossly 4+/5 with  no pain (03/23/16)   Status On-going     PT LONG TERM GOAL #4   Title Patient to improve posture to neutral alignment with ability self-correct (03/23/16)   Status Achieved     PT LONG TERM GOAL #5   Title Patient to demonstrate overhead lifting with no pain for return to work and normal exercise regimen (03/23/16)   Status On-going               Plan - 03/16/16 [id number]    Clinical Impression Statement Pt. tolerating progression of overhead strengthening activity well today without pain in treatment.  Pt. reporting still feeling, "pulling" sensation with some reaching activities at this point.  Work simulation continued today with lifting above shoulder height ~30# box.  Pt. pain free with this and able to demo good technique.  Pt. progressing well at this point.  Will plan to continue update of HEP and prep for d/c in coming visits.     PT Treatment/Interventions ADLs/Self Care Home Management;Cryotherapy;Electrical Stimulation;Iontophoresis 4mg /ml Dexamethasone;Moist Heat;Ultrasound;Therapeutic exercise;Therapeutic activities;Patient/family education;Manual techniques;Passive range of motion;Vasopneumatic Device;Taping;Dry needling   PT Next Visit Plan HEP review/update with prep for d/c; continued overhead strengthening; Work simulation      Patient will benefit from skilled therapeutic intervention in order to improve the following deficits and impairments:  Decreased activity tolerance, Decreased range of motion, Decreased strength, Impaired UE functional use, Pain  Visit Diagnosis: Acute pain of left shoulder  Stiffness of left shoulder, not elsewhere classified  Muscle weakness (generalized)  Abnormal posture     Problem List Patient Active Problem List   Diagnosis Date Noted  . Glenoid fracture of shoulder, left, closed, initial encounter 12/30/2015  . HTN (hypertension) 12/21/2011  . Insomnia 12/21/2011  . Hematuria 09/28/2011  . General medical examination  11/04/2010  . Erectile dysfunction 09/02/2010  . DYSPNEA 02/07/2010  . EXERCISE INDUCED BRONCHOSPASM 12/16/2009  . RAYNAUD'S SYNDROME 03/14/2009  . PLEURISY 03/14/2009  . FATIGUE 03/14/2009  . HYPERPOTASSEMIA 02/21/2009  . LATERAL EPICONDYLITIS, RIGHT 08/27/2008  . HYPERLIPIDEMIA 05/28/2008  . HEMATURIA 09/17/2006  . ABNORMAL FINDINGS, ELEVATED BP W/O HTN 09/17/2006    Kermit BaloMicah Jackalyn Haith, PTA 03/16/16 12:56 PM  [facility] [address]  [address] [place], [place], [address] Phone: [phone]   Fax:  [phone]  Name: Andria MeuseJoseph Blackie MRN: [id number] Date of Birth: 01/14/1966

## 2016-03-19 ENCOUNTER — Ambulatory Visit: Payer: BLUE CROSS/BLUE SHIELD | Admitting: Physical Therapy

## 2016-03-19 DIAGNOSIS — R293 Abnormal posture: Secondary | ICD-10-CM

## 2016-03-19 DIAGNOSIS — M25612 Stiffness of left shoulder, not elsewhere classified: Secondary | ICD-10-CM

## 2016-03-19 DIAGNOSIS — M25512 Pain in left shoulder: Secondary | ICD-10-CM

## 2016-03-19 DIAGNOSIS — M6281 Muscle weakness (generalized): Secondary | ICD-10-CM

## 2016-03-19 NOTE — Therapy (Signed)
[facility] [address]  [address] [place], [place], [address] Phone: [phone]   Fax:  [phone]  Physical Therapy Treatment  Patient Details  Name: Duane MeuseJoseph Kirk MRN: [id number] Date of Birth: 07/26/1965 Referring Provider: Dr. Roda ShuttersXu  Encounter Date: 03/19/2016      PT End of Session - 03/19/16 0924    Visit Number 11   Number of Visits 12   Date for PT Re-Evaluation 03/23/16   PT Start Time 0841   PT Stop Time 0926   PT Time Calculation (min) 45 min   Activity Tolerance Patient tolerated treatment well   Behavior During Therapy [facility] for tasks assessed/performed      Past Medical History:  Diagnosis Date  . DOE (dyspnea on exertion)    ?hyperventilation syndrome  . Hyperlipidemia   . Hypertension    off meds    Past Surgical History:  Procedure Laterality Date  . EXCISION METACARPAL MASS Right 11/10/2013   Procedure: RIGHT INDEX AND LONG EXCISION MASS AND DEBRIDEMENT DISTAL INTERPHALANGEAL JOINT ;  Surgeon: Betha LoaKevin Kuzma, MD;  Location: [facility];  Service: Orthopedics;  Laterality: Right;  . TENDON REPAIR  2008   left bicep tendon repair    There were no vitals filed for this visit.      Subjective Assessment - 03/19/16 0843    Subjective Feeling well - little sore after Mondays session   Pertinent History HTN, concussion after fall   Patient Stated Goals regain normal function of L shoulder   Currently in Pain? No/denies   Multiple Pain Sites No                         [facility] Adult PT Treatment/Exercise - 03/19/16 0001      Shoulder Exercises: Prone   Extension Strengthening;Both;15 reps;Weights   Extension Weight (lbs) 5   Extension Limitations I's over green p-ball (65cm)   External Rotation Weight (lbs) 5   External Rotation Limitations W's over green P-ball (65cm)    Horizontal ABduction 1 Strengthening;Weights;Both;15 reps   Horizontal ABduction 1 Weight (lbs)  5   Horizontal ABduction 1 Limitations T's over green p-ball (65cm)    Horizontal ABduction 2 Strengthening;Both;15 reps;Weights   Horizontal ABduction 2 Weight (lbs) 5   Horizontal ABduction 2 Limitations Y's over green p-ball   Other Prone Exercises plank - hands and feet 3 x 30 sec; L sided 3 x 30     Shoulder Exercises: Standing   External Rotation Strengthening;Left;15 reps;Theraband   Theraband Level (Shoulder External Rotation) Level 4 (Blue)   External Rotation Limitations overhead x 2 sets   Internal Rotation Strengthening;Left;15 reps;Theraband   Theraband Level (Shoulder Internal Rotation) Level 4 (Blue)   Internal Rotation Limitations overhead x 2 sets   Row Strengthening;Both;15 reps   Row Limitations TRX; W's on TRX x 15 reps   Other Standing Exercises lifting box with 15# - floor to shoulder height x 15 reps   Other Standing Exercises PNF D1/D2 flexion/extension - blue tband x 15 reps each (4 way resisted)     Shoulder Exercises: ROM/Strengthening   UBE (Upper Arm Bike) level 5 x 6 minutes (3/3)   Cybex Row 15 reps   Cybex Row Limitations 65#   Other ROM/Strengthening Exercises BATCA push up plus 35# x 20 reps   Other ROM/Strengthening Exercises BATCA pull down - 55# x 15 reps  PT Long Term Goals - 03/12/16 1319      PT LONG TERM GOAL #1   Title patient to be independent with advanced HEP (03/23/16)   Status On-going     PT LONG TERM GOAL #2   Title Patient to improve L shoulder ROM equal to that of R shoulder with no pain (03/23/16)   Status Achieved     PT LONG TERM GOAL #3   Title Patient to improve L shoulder strength to grossly 4+/5 with no pain (03/23/16)   Status On-going     PT LONG TERM GOAL #4   Title Patient to improve posture to neutral alignment with ability self-correct (03/23/16)   Status Achieved     PT LONG TERM GOAL #5   Title Patient to demonstrate overhead lifting with no pain for return to work and normal  exercise regimen (03/23/16)   Status On-going               Plan - 03/19/16 0927    Clinical Impression Statement Patient doing well today - reports he is going to attmept to start jogging/running this weekend. Patient doing well with all lifitng from ground to above shoulder height with no increase in pain, only reports of some fatigue. Patient to continue to benefit from PT to maximize function and prepare for discharge for full return to work duties.    PT Treatment/Interventions ADLs/Self Care Home Management;Cryotherapy;Electrical Stimulation;Iontophoresis 4mg /ml Dexamethasone;Moist Heat;Ultrasound;Therapeutic exercise;Therapeutic activities;Patient/family education;Manual techniques;Passive range of motion;Vasopneumatic Device;Taping;Dry needling   PT Next Visit Plan work simulation tasks - prepare for discharge next Thursday   Consulted and Agree with Plan of Care Patient      Patient will benefit from skilled therapeutic intervention in order to improve the following deficits and impairments:  Decreased activity tolerance, Decreased range of motion, Decreased strength, Impaired UE functional use, Pain  Visit Diagnosis: Acute pain of left shoulder  Stiffness of left shoulder, not elsewhere classified  Muscle weakness (generalized)  Abnormal posture     Problem List Patient Active Problem List   Diagnosis Date Noted  . Glenoid fracture of shoulder, left, closed, initial encounter 12/30/2015  . HTN (hypertension) 12/21/2011  . Insomnia 12/21/2011  . Hematuria 09/28/2011  . General medical examination 11/04/2010  . Erectile dysfunction 09/02/2010  . DYSPNEA 02/07/2010  . EXERCISE INDUCED BRONCHOSPASM 12/16/2009  . RAYNAUD'S SYNDROME 03/14/2009  . PLEURISY 03/14/2009  . FATIGUE 03/14/2009  . HYPERPOTASSEMIA 02/21/2009  . LATERAL EPICONDYLITIS, RIGHT 08/27/2008  . HYPERLIPIDEMIA 05/28/2008  . HEMATURIA 09/17/2006  . ABNORMAL FINDINGS, ELEVATED BP W/O HTN  09/17/2006     Kipp Laurence, PT, DPT 03/19/16 1:43 PM   [facility] [address]  [address] [place], [place], [address] Phone: [phone]   Fax:  [phone]  Name: Duane Kirk MRN: [id number] Date of Birth: December 23, 1965

## 2016-03-23 ENCOUNTER — Ambulatory Visit: Payer: BLUE CROSS/BLUE SHIELD | Admitting: Physical Therapy

## 2016-03-23 DIAGNOSIS — M25512 Pain in left shoulder: Secondary | ICD-10-CM | POA: Diagnosis not present

## 2016-03-23 DIAGNOSIS — M25612 Stiffness of left shoulder, not elsewhere classified: Secondary | ICD-10-CM

## 2016-03-23 DIAGNOSIS — M6281 Muscle weakness (generalized): Secondary | ICD-10-CM

## 2016-03-23 DIAGNOSIS — R293 Abnormal posture: Secondary | ICD-10-CM

## 2016-03-23 NOTE — Therapy (Signed)
[facility] [address]  [address] [place], [place], [address] Phone: [phone]   Fax:  [phone]  Physical Therapy Treatment  Patient Details  Name: Duane Kirk MRN: [id number] Date of Birth: 1965-12-03 Referring Provider: Dr. Roda Shutters  Encounter Date: 03/23/2016      PT End of Session - 03/23/16 0913    Visit Number 12   Number of Visits 12   Date for PT Re-Evaluation 03/23/16   PT Start Time 0850   PT Stop Time 0929   PT Time Calculation (min) 39 min   Activity Tolerance Patient tolerated treatment well   Behavior During Therapy [facility] for tasks assessed/performed      Past Medical History:  Diagnosis Date  . DOE (dyspnea on exertion)    ?hyperventilation syndrome  . Hyperlipidemia   . Hypertension    off meds    Past Surgical History:  Procedure Laterality Date  . EXCISION METACARPAL MASS Right 11/10/2013   Procedure: RIGHT INDEX AND LONG EXCISION MASS AND DEBRIDEMENT DISTAL INTERPHALANGEAL JOINT ;  Surgeon: Betha Loa, MD;  Location: [facility];  Service: Orthopedics;  Laterality: Right;  . TENDON REPAIR  2008   left bicep tendon repair    There were no vitals filed for this visit.      Subjective Assessment - 03/23/16 0913    Subjective was able to jog over the weekend - no shoulder pain   Pertinent History HTN, concussion after fall   Patient Stated Goals regain normal function of L shoulder   Currently in Pain? No/denies   Multiple Pain Sites No                         [facility] Adult PT Treatment/Exercise - 03/23/16 0001      Shoulder Exercises: Prone   Extension Strengthening;Both;15 reps;Weights   Extension Weight (lbs) 5   Extension Limitations I's over green p-ball (65cm)   Horizontal ABduction 1 Strengthening;Weights;Both;15 reps   Horizontal ABduction 1 Weight (lbs) 5   Horizontal ABduction 1 Limitations T's over green p-ball (65cm)    Horizontal ABduction 2  Strengthening;Both;15 reps;Weights   Horizontal ABduction 2 Weight (lbs) 5   Horizontal ABduction 2 Limitations Y's over green p-ball   Other Prone Exercises plank - hands and feet 3 x 60 sec; L sided 3 x 60     Shoulder Exercises: Standing   Row Strengthening;Both;15 reps   Row Limitations TRX; W's on TRX x 15 reps; eccentric T's on TRX x 10 reps   Other Standing Exercises PNF D1/D2 flexion/extension - blue tband x 15 reps each (4 way resisted)     Shoulder Exercises: ROM/Strengthening   UBE (Upper Arm Bike) level 5 x 6 minutes (3/3)   Cybex Row 15 reps   Cybex Row Limitations 65#   Other ROM/Strengthening Exercises BATCA push up plus 35# x 20 reps     Shoulder Exercises: Body Blade   Flexion 45 seconds;2 reps   External Rotation 45 seconds;2 reps                     PT Long Term Goals - 03/12/16 1319      PT LONG TERM GOAL #1   Title patient to be independent with advanced HEP (03/23/16)   Status On-going     PT LONG TERM GOAL #2   Title Patient to improve L shoulder ROM equal to that of R shoulder  with no pain (03/23/16)   Status Achieved     PT LONG TERM GOAL #3   Title Patient to improve L shoulder strength to grossly 4+/5 with no pain (03/23/16)   Status On-going     PT LONG TERM GOAL #4   Title Patient to improve posture to neutral alignment with ability self-correct (03/23/16)   Status Achieved     PT LONG TERM GOAL #5   Title Patient to demonstrate overhead lifting with no pain for return to work and normal exercise regimen (03/23/16)   Status On-going               Plan - 03/23/16 0915    Clinical Impression Statement Patient doing well today - subjective reports of being able to return to jogging with no increase in shoulder pain as well as increase weight in gym with only appropriate muscle soreness reported. Patient doing well with all progressions in therapy with some fatigue noted during prolonged resistance work. Patient to return on  Thurday for discharge and review of HEP.    PT Treatment/Interventions ADLs/Self Care Home Management;Cryotherapy;Electrical Stimulation;Iontophoresis 4mg /ml Dexamethasone;Moist Heat;Ultrasound;Therapeutic exercise;Therapeutic activities;Patient/family education;Manual techniques;Passive range of motion;Vasopneumatic Device;Taping;Dry needling   PT Next Visit Plan prepare for discharge on Thursday   Consulted and Agree with Plan of Care Patient      Patient will benefit from skilled therapeutic intervention in order to improve the following deficits and impairments:  Decreased activity tolerance, Decreased range of motion, Decreased strength, Impaired UE functional use, Pain  Visit Diagnosis: Acute pain of left shoulder  Stiffness of left shoulder, not elsewhere classified  Muscle weakness (generalized)  Abnormal posture     Problem List Patient Active Problem List   Diagnosis Date Noted  . Glenoid fracture of shoulder, left, closed, initial encounter 12/30/2015  . HTN (hypertension) 12/21/2011  . Insomnia 12/21/2011  . Hematuria 09/28/2011  . General medical examination 11/04/2010  . Erectile dysfunction 09/02/2010  . DYSPNEA 02/07/2010  . EXERCISE INDUCED BRONCHOSPASM 12/16/2009  . RAYNAUD'S SYNDROME 03/14/2009  . PLEURISY 03/14/2009  . FATIGUE 03/14/2009  . HYPERPOTASSEMIA 02/21/2009  . LATERAL EPICONDYLITIS, RIGHT 08/27/2008  . HYPERLIPIDEMIA 05/28/2008  . HEMATURIA 09/17/2006  . ABNORMAL FINDINGS, ELEVATED BP W/O HTN 09/17/2006     Kipp LaurenceStephanie R Aaron, PT, DPT 03/23/16 2:12 PM   [facility] [place] [address]  [address] [place], [place], [address] Phone: [phone]   Fax:  [phone]  Name: Duane Kirk MRN: [id number] Date of Birth: 07/25/1965

## 2016-03-26 ENCOUNTER — Ambulatory Visit: Payer: BLUE CROSS/BLUE SHIELD | Admitting: Physical Therapy

## 2016-03-26 DIAGNOSIS — M25612 Stiffness of left shoulder, not elsewhere classified: Secondary | ICD-10-CM

## 2016-03-26 DIAGNOSIS — M25512 Pain in left shoulder: Secondary | ICD-10-CM

## 2016-03-26 DIAGNOSIS — R293 Abnormal posture: Secondary | ICD-10-CM

## 2016-03-26 DIAGNOSIS — M6281 Muscle weakness (generalized): Secondary | ICD-10-CM

## 2016-03-26 NOTE — Therapy (Signed)
[facility] [address]  [place], [place], [address] Phone: [phone]   Fax:  [phone]  Physical Therapy Treatment  Patient Details  Name: Duane Kirk MRN: [id number] Date of Birth: 1965-06-16 Referring Provider: Dr. Erlinda Hong  Encounter Date: 03/26/2016      PT End of Session - 03/26/16 0841    PT Start Time 0840   PT Stop Time 0921   PT Time Calculation (min) 41 min   Activity Tolerance Patient tolerated treatment well   Behavior During Therapy [facility] for tasks assessed/performed      Past Medical History:  Diagnosis Date  . DOE (dyspnea on exertion)    ?hyperventilation syndrome  . Hyperlipidemia   . Hypertension    off meds    Past Surgical History:  Procedure Laterality Date  . EXCISION METACARPAL MASS Right 11/10/2013   Procedure: RIGHT INDEX AND LONG EXCISION MASS AND DEBRIDEMENT DISTAL INTERPHALANGEAL JOINT ;  Surgeon: Leanora Cover, MD;  Location: [place];  Service: Orthopedics;  Laterality: Right;  . TENDON REPAIR  2008   left bicep tendon repair    There were no vitals filed for this visit.      Subjective Assessment - 03/26/16 0841    Subjective patient here for last visit - will return to work on 04/06/16. Feels well.   Pertinent History HTN, concussion after fall   Patient Stated Goals regain normal function of L shoulder   Currently in Pain? No/denies   Multiple Pain Sites No            [facility] PT Assessment - 03/26/16 0848      Assessment   Medical Diagnosis L glenoid fracture   Onset Date/Surgical Date 12/28/15   Hand Dominance Right     Strength   Left Shoulder Flexion 5/5   Left Shoulder ABduction 5/5   Left Shoulder Internal Rotation 4+/5   Left Shoulder External Rotation 4+/5                     [facility] Adult PT Treatment/Exercise - 03/26/16 0844      Shoulder Exercises: Seated   Other Seated Exercises pronation/supination - arm extended  forward - 3# x 15 reps     Shoulder Exercises: Prone   Extension Strengthening;Both;15 reps;Weights   Extension Weight (lbs) 5   Extension Limitations I's over green p-ball (65cm)   Horizontal ABduction 1 Strengthening;Weights;Both;15 reps   Horizontal ABduction 1 Weight (lbs) 5   Horizontal ABduction 1 Limitations T's over green p-ball (65cm)    Horizontal ABduction 2 Strengthening;Both;15 reps;Weights   Horizontal ABduction 2 Weight (lbs) 5   Horizontal ABduction 2 Limitations Y's over green p-ball   Other Prone Exercises plank - hands and feet 3 x 60 sec; L sided 3 x 60     Shoulder Exercises: Standing   Row Strengthening;Both;15 reps   Row Limitations TRX; W's on TRX x 15 reps; eccentric T's on TRX x 10 reps   Other Standing Exercises PNF D1/D2 flexion/extension - black tband x 15 reps each (4 way resisted)     Shoulder Exercises: ROM/Strengthening   UBE (Upper Arm Bike) level 6 x 6 minutes (3/3)   Cybex Row 15 reps   Cybex Row Limitations 65# - 2 sets   Other ROM/Strengthening Exercises BATCA pull down - 55# x 15 reps      Shoulder Exercises: Body Blade   External Rotation 45 seconds;2 reps  PT Long Term Goals - 03/26/16 4431      PT LONG TERM GOAL #1   Title patient to be independent with advanced HEP (03/23/16)   Status Achieved     PT LONG TERM GOAL #2   Title Patient to improve L shoulder ROM equal to that of R shoulder with no pain (03/23/16)   Status Achieved     PT LONG TERM GOAL #3   Title Patient to improve L shoulder strength to grossly 4+/5 with no pain (03/23/16)   Status Achieved     PT LONG TERM GOAL #4   Title Patient to improve posture to neutral alignment with ability self-correct (03/23/16)   Status Achieved     PT LONG TERM GOAL #5   Title Patient to demonstrate overhead lifting with no pain for return to work and normal exercise regimen (03/23/16)   Status Achieved               Plan - 03/26/16 0843     Clinical Impression Statement Duane Kirk has done well with PT - has achieved all goals, returned to exercising 3-5x/week, and has improved functional use of L UE. Patient to return to work on 04/06/16 full time and to resume normal responsibilities. Patient feeling well with discharge status today with no concerns going forward. Patient welcome to return to PT in the future for any other needs.    PT Treatment/Interventions ADLs/Self Care Home Management;Cryotherapy;Electrical Stimulation;Iontophoresis 72m/ml Dexamethasone;Moist Heat;Ultrasound;Therapeutic exercise;Therapeutic activities;Patient/family education;Manual techniques;Passive range of motion;Vasopneumatic Device;Taping;Dry needling   PT Next Visit Plan d/c on this date   Consulted and Agree with Plan of Care Patient      Patient will benefit from skilled therapeutic intervention in order to improve the following deficits and impairments:  Decreased activity tolerance, Decreased range of motion, Decreased strength, Impaired UE functional use, Pain  Visit Diagnosis: Acute pain of left shoulder  Stiffness of left shoulder, not elsewhere classified  Muscle weakness (generalized)  Abnormal posture     Problem List Patient Active Problem List   Diagnosis Date Noted  . Glenoid fracture of shoulder, left, closed, initial encounter 12/30/2015  . HTN (hypertension) 12/21/2011  . Insomnia 12/21/2011  . Hematuria 09/28/2011  . General medical examination 11/04/2010  . Erectile dysfunction 09/02/2010  . DYSPNEA 02/07/2010  . EXERCISE INDUCED BRONCHOSPASM 12/16/2009  . RAYNAUD'S SYNDROME 03/14/2009  . PLEURISY 03/14/2009  . FATIGUE 03/14/2009  . HYPERPOTASSEMIA 02/21/2009  . LATERAL EPICONDYLITIS, RIGHT 08/27/2008  . HYPERLIPIDEMIA 05/28/2008  . HEMATURIA 09/17/2006  . ABNORMAL FINDINGS, ELEVATED BP W/O HTN 09/17/2006     SLanney Gins PT, DPT 03/26/16 1:12 PM   PHYSICAL THERAPY DISCHARGE SUMMARY  Visits from Start  of Care: 13  Current functional level related to goals / functional outcomes: See above   Remaining deficits: See above; none - will return to full time work at full capacity on 04/06/16   Education / Equipment: HEP  Plan: Patient agrees to discharge.  Patient goals were met. Patient is being discharged due to meeting the stated rehab goals.  ?????     SLanney Gins PT, DPT 03/26/16 1:12 PM   [facility] [place] [address]Phone: [phone]  Fax:  [phone] Name: Duane RiseMRN: [id number] of Birth: [birth year]

## 2017-03-29 ENCOUNTER — Encounter (INDEPENDENT_AMBULATORY_CARE_PROVIDER_SITE_OTHER): Payer: BLUE CROSS/BLUE SHIELD | Admitting: Ophthalmology

## 2017-12-02 IMAGING — CR DG SHOULDER 2+V*L*
2 series · 2 of 2 positions shown · non-contrast
Comparison: None.

CLINICAL DATA: Left shoulder pain, fall 10 feet from ladder on
concrete

EXAM:
LEFT SHOULDER - 2+ VIEW

[shoulder grashey]
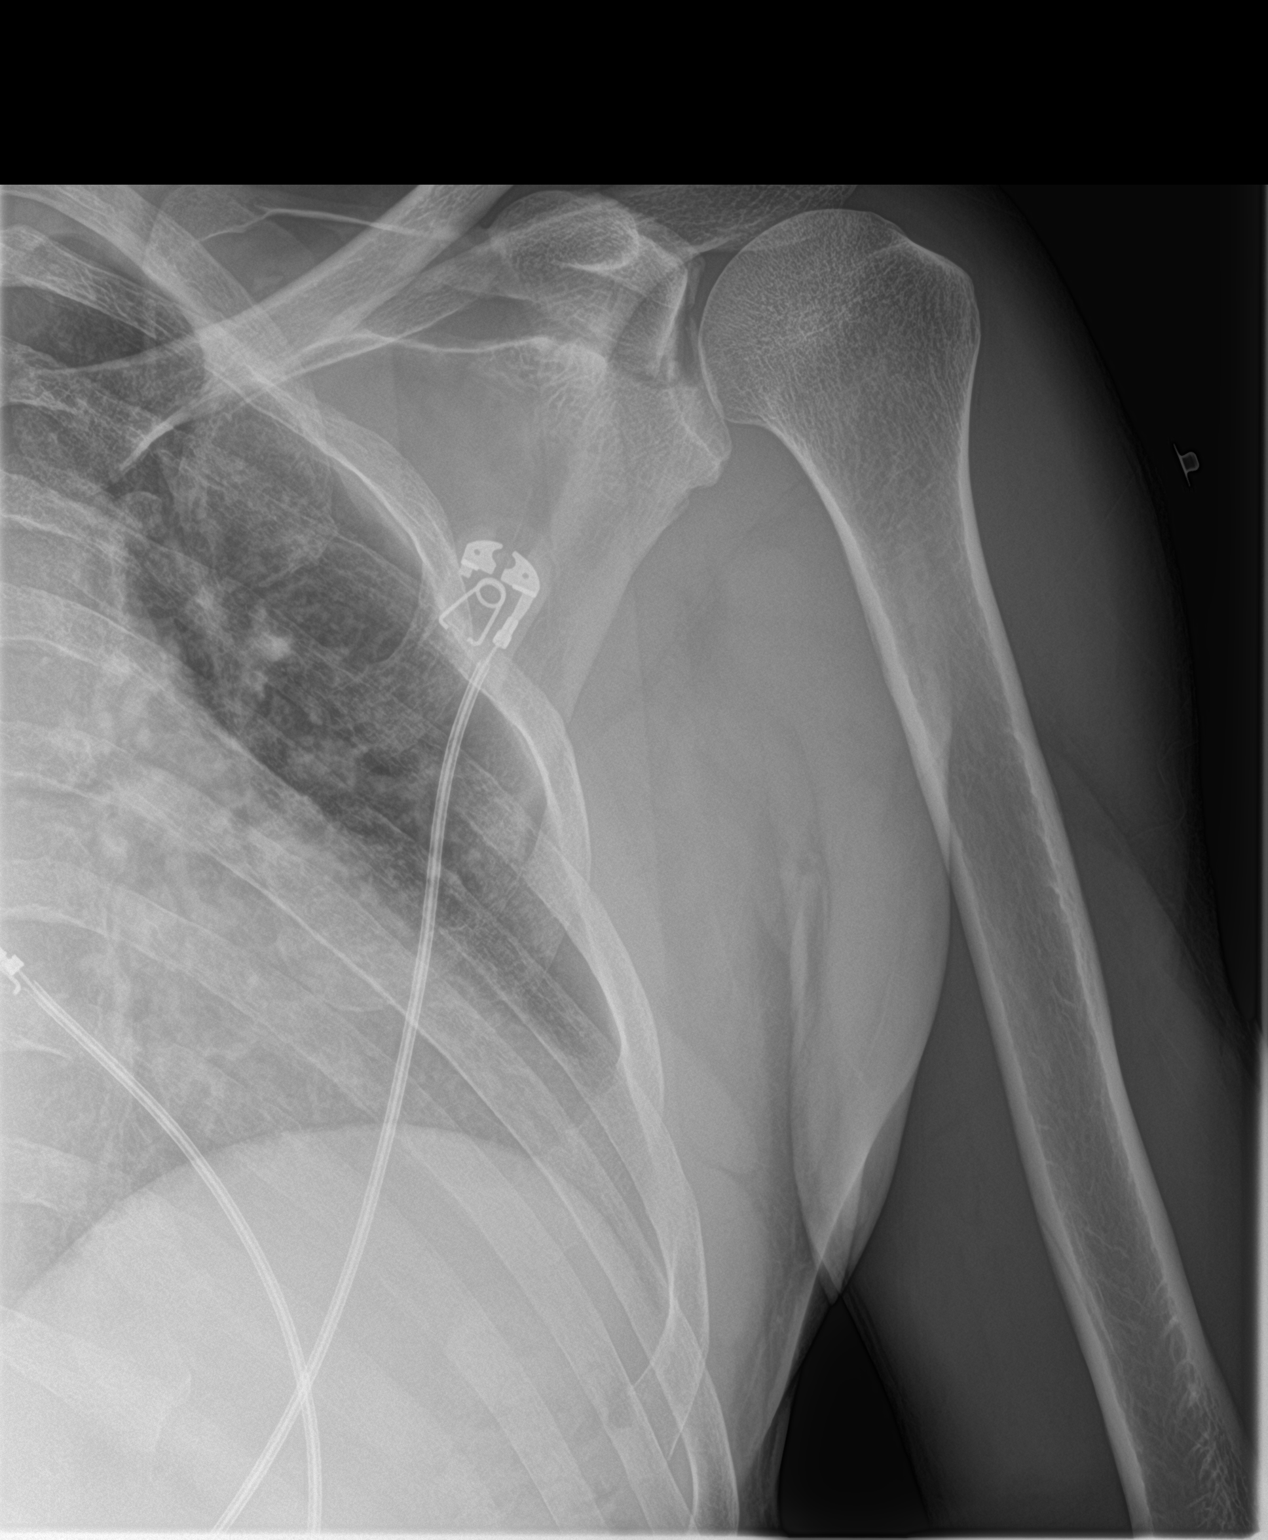

[shoulder y view]
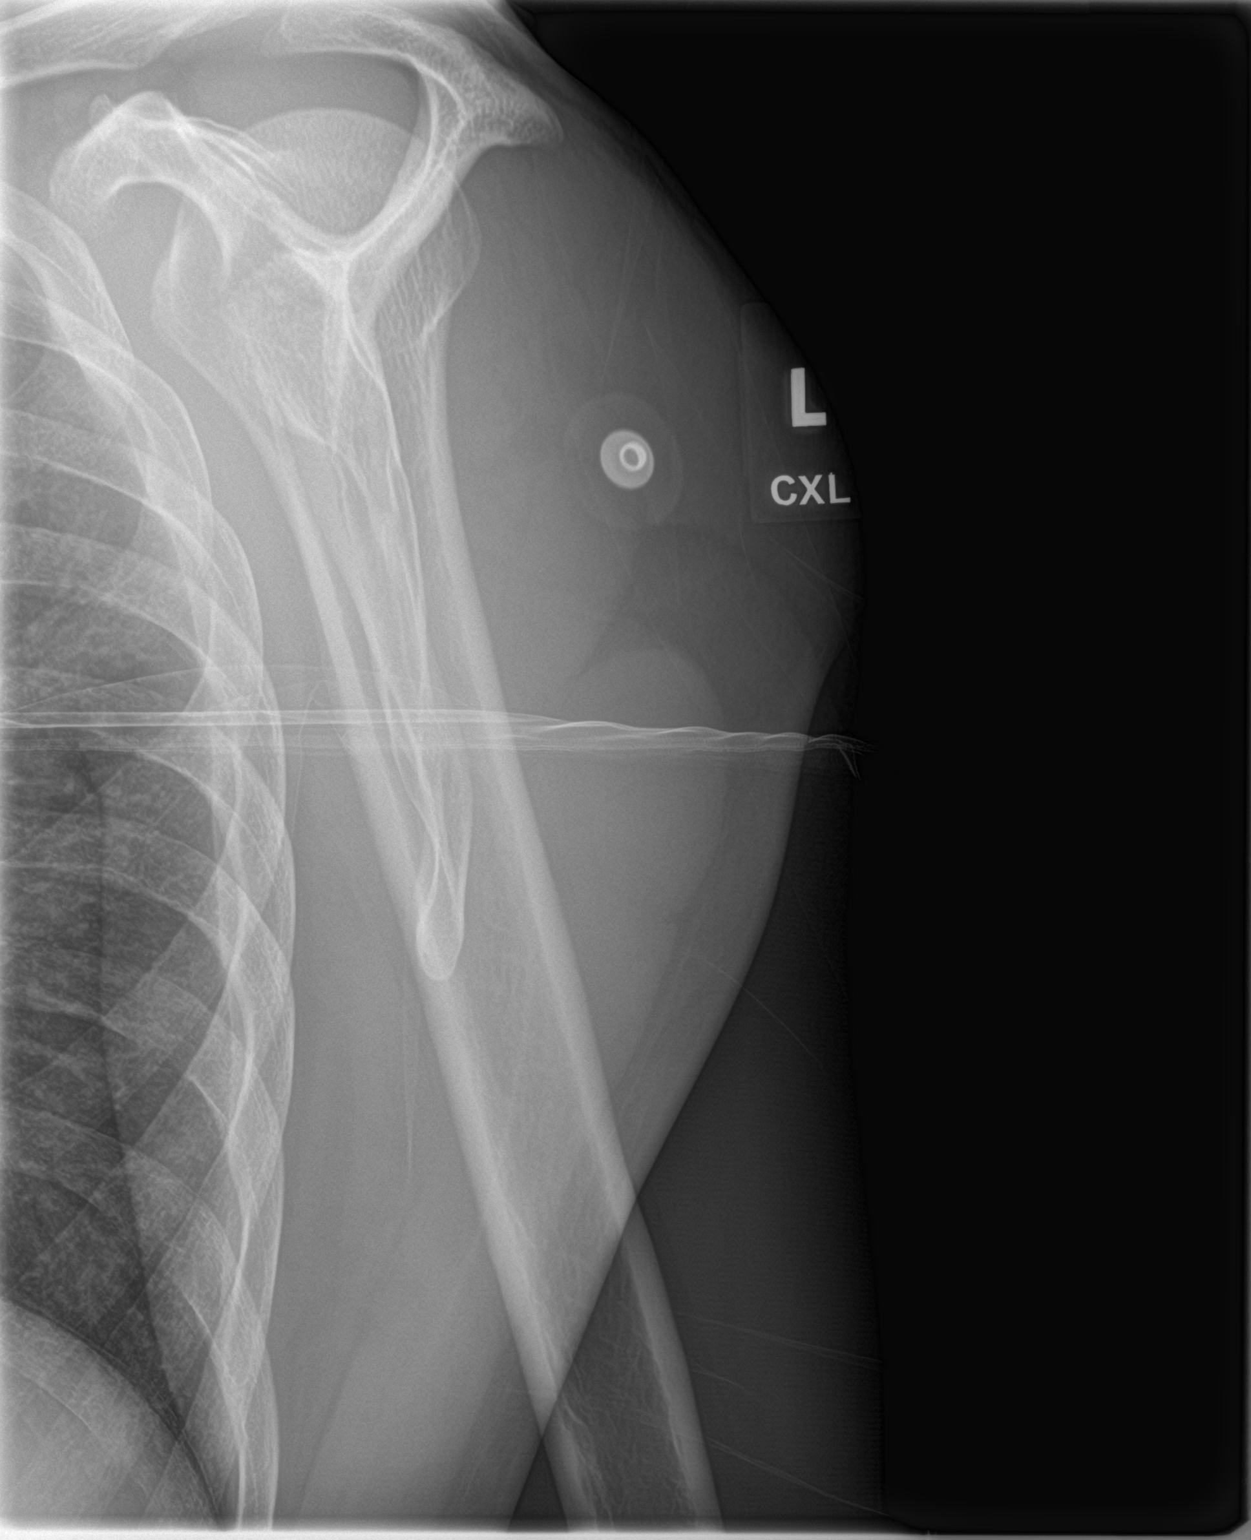

[2 of 2 positions shown; findings below may reference images not displayed]

FINDINGS: Two views of the left shoulder submitted. No shoulder dislocation.
There is lucent line in left glenoid suspicious for nondisplaced
fracture of the left scapula. Clinical correlation is necessary.
Further correlation with CT scan or MRI could be performed.
IMPRESSION: No shoulder dislocation. There is lucent line in left glenoid
suspicious for nondisplaced fracture of the left scapula. Clinical
correlation is necessary. Further correlation with CT scan or MRI
could be performed.

## 2017-12-02 IMAGING — CR DG LUMBAR SPINE COMPLETE 4+V
5 series · 5 of 5 positions shown · non-contrast
Comparison: None.

CLINICAL DATA: Recent fall from ladder with low back pain, initial
encounter

EXAM:
LUMBAR SPINE - COMPLETE 4+ VIEW

[l-spine ap]
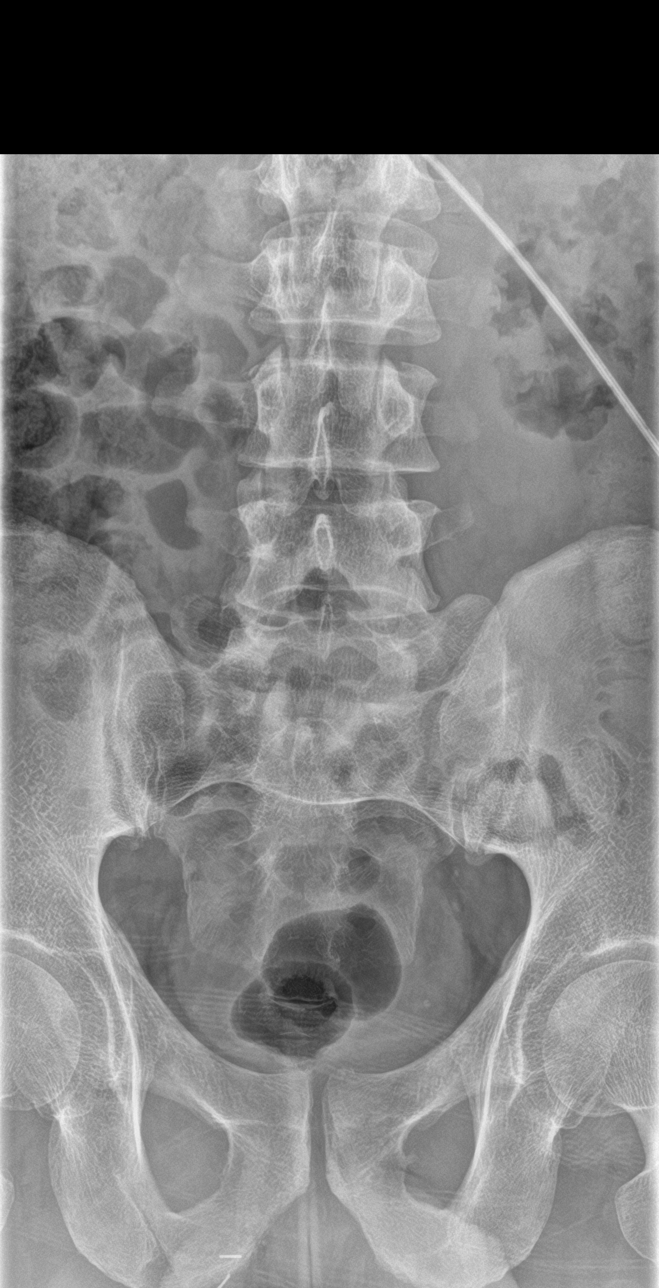

[l-spine obl (1 of 2)]
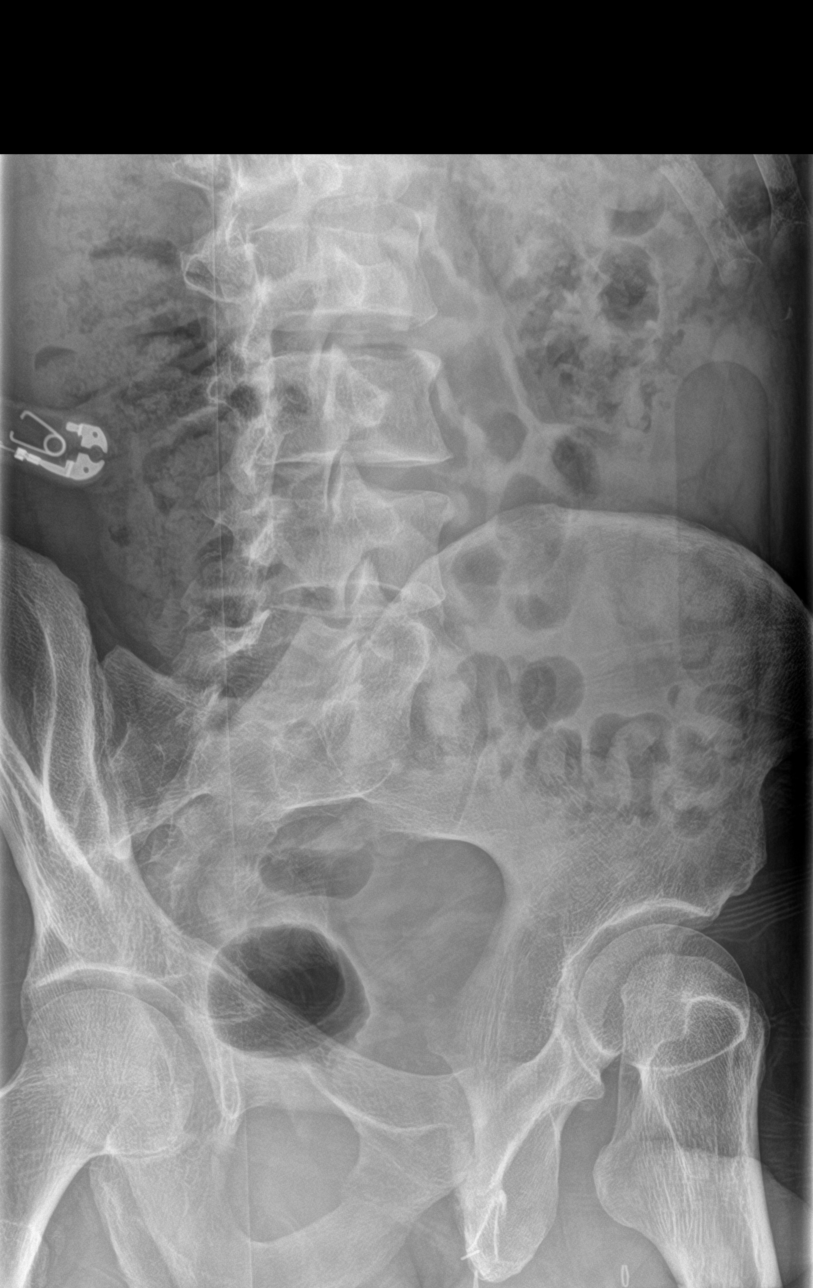

[l-spine obl (2 of 2)]
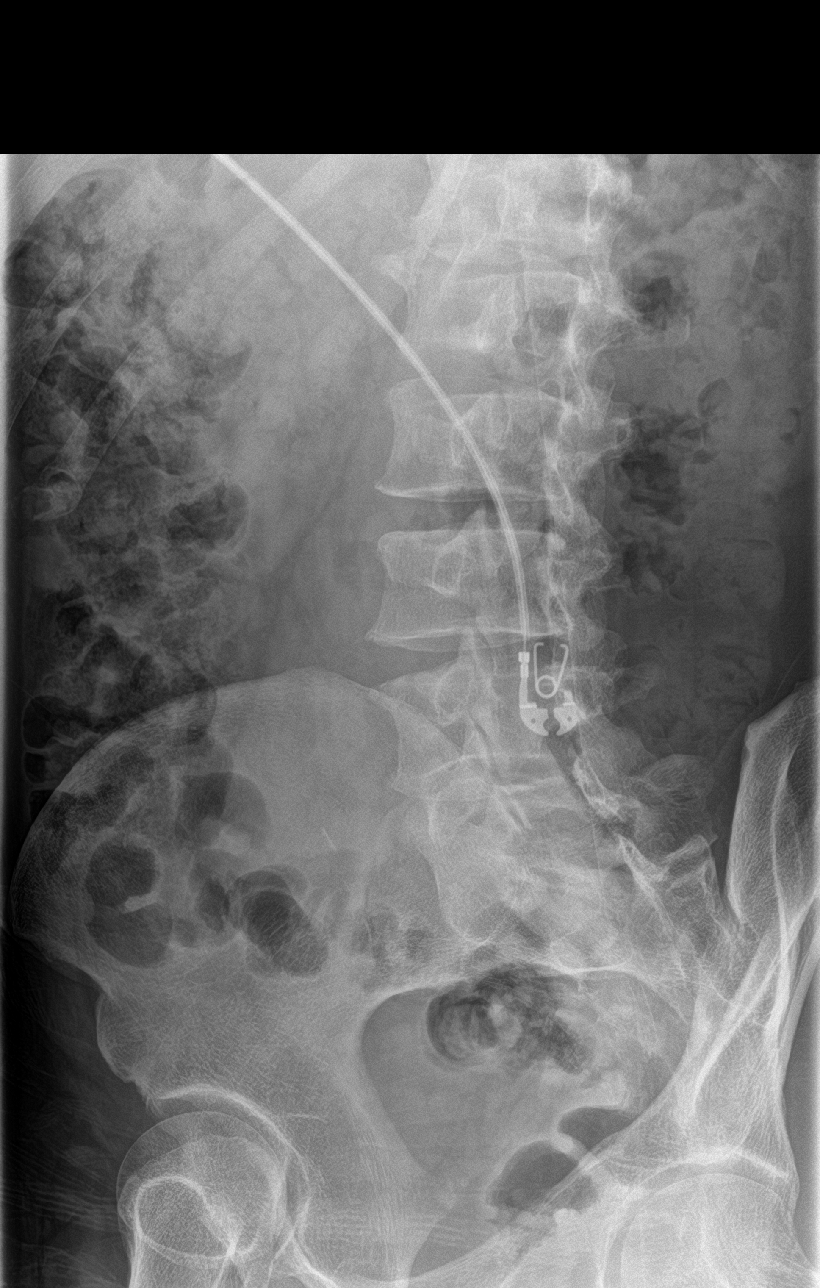

[l-spine lat]
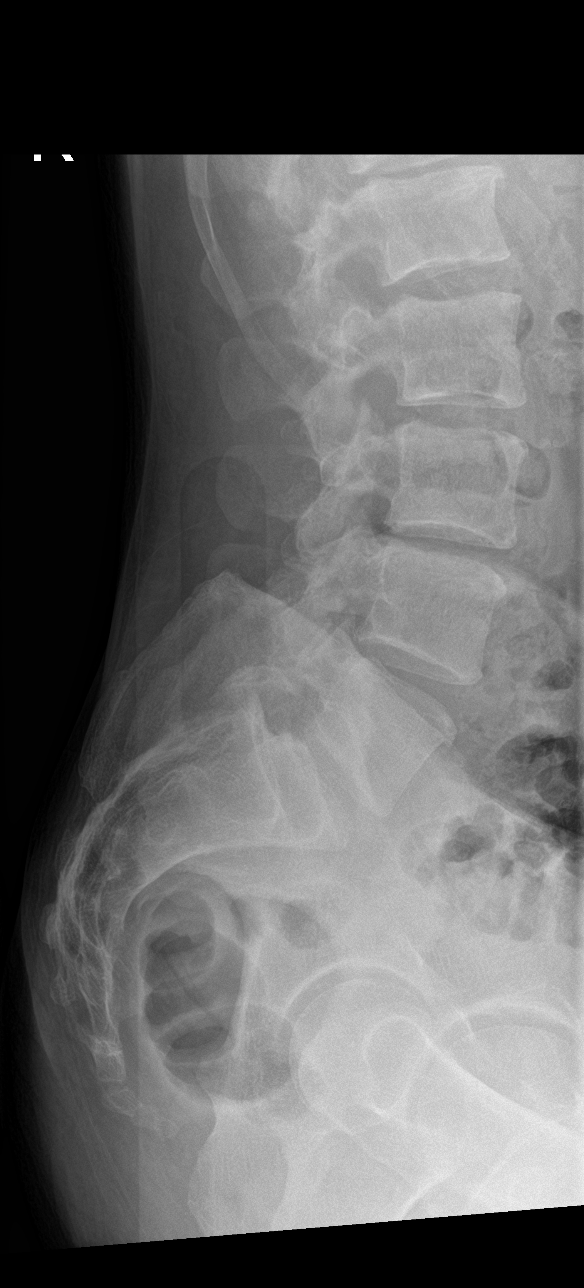

[l-spine spot]
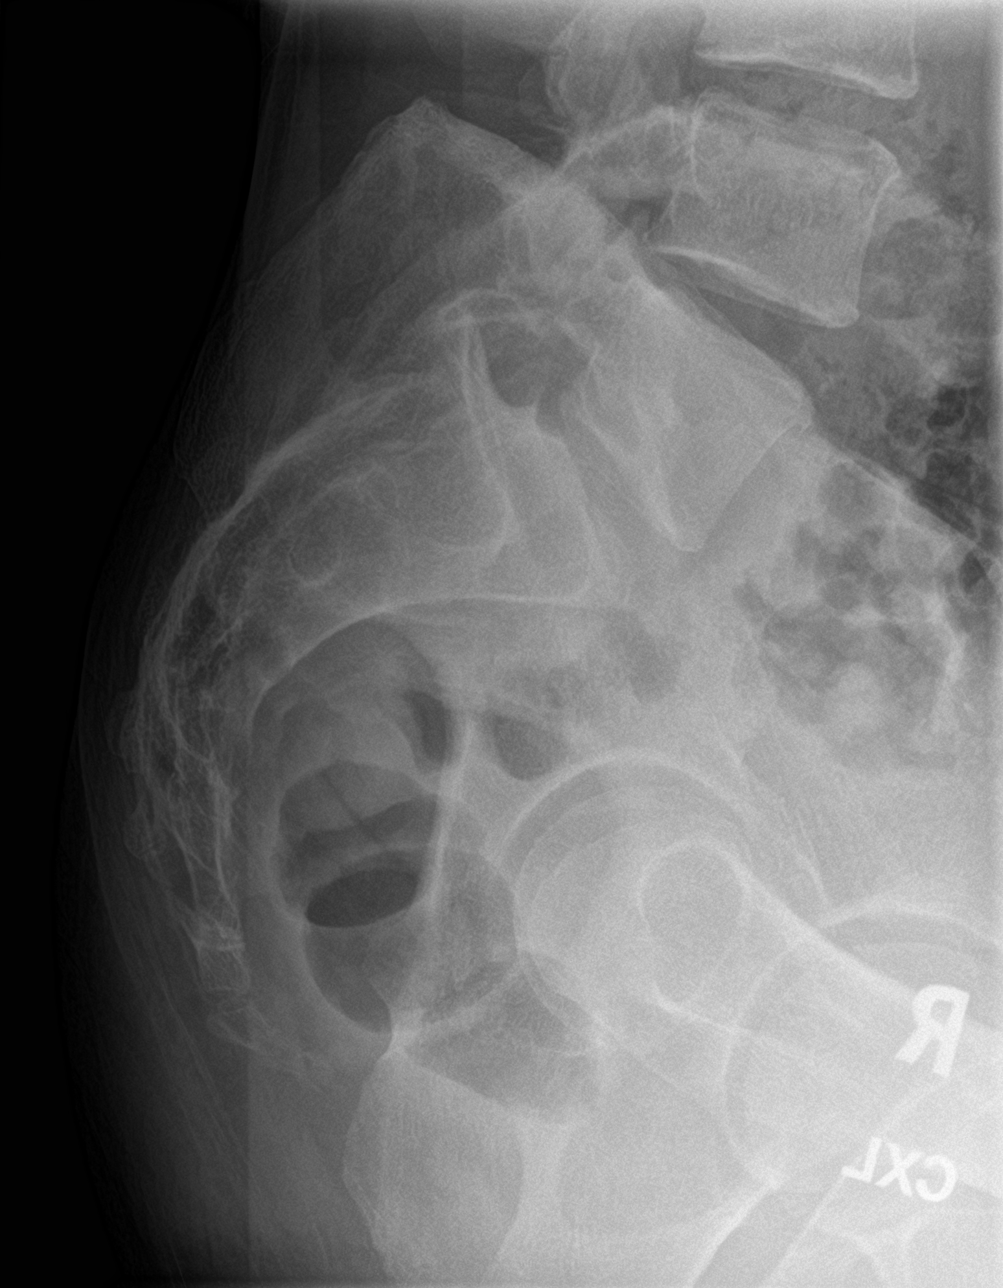

[5 of 5 positions shown; findings below may reference images not displayed]

FINDINGS: There is no evidence of lumbar spine fracture. Alignment is normal.
Intervertebral disc spaces are maintained.
IMPRESSION: No acute abnormality noted.

## 2018-06-09 IMAGING — DX DG SHOULDER 2+V*L*
3 series · 3 of 3 positions shown · non-contrast
Comparison: 12/28/2015

CLINICAL DATA: History of left glenoid fracture.  Follow-up.

EXAM:
LEFT SHOULDER - 2+ VIEW

[shoulder grashey]
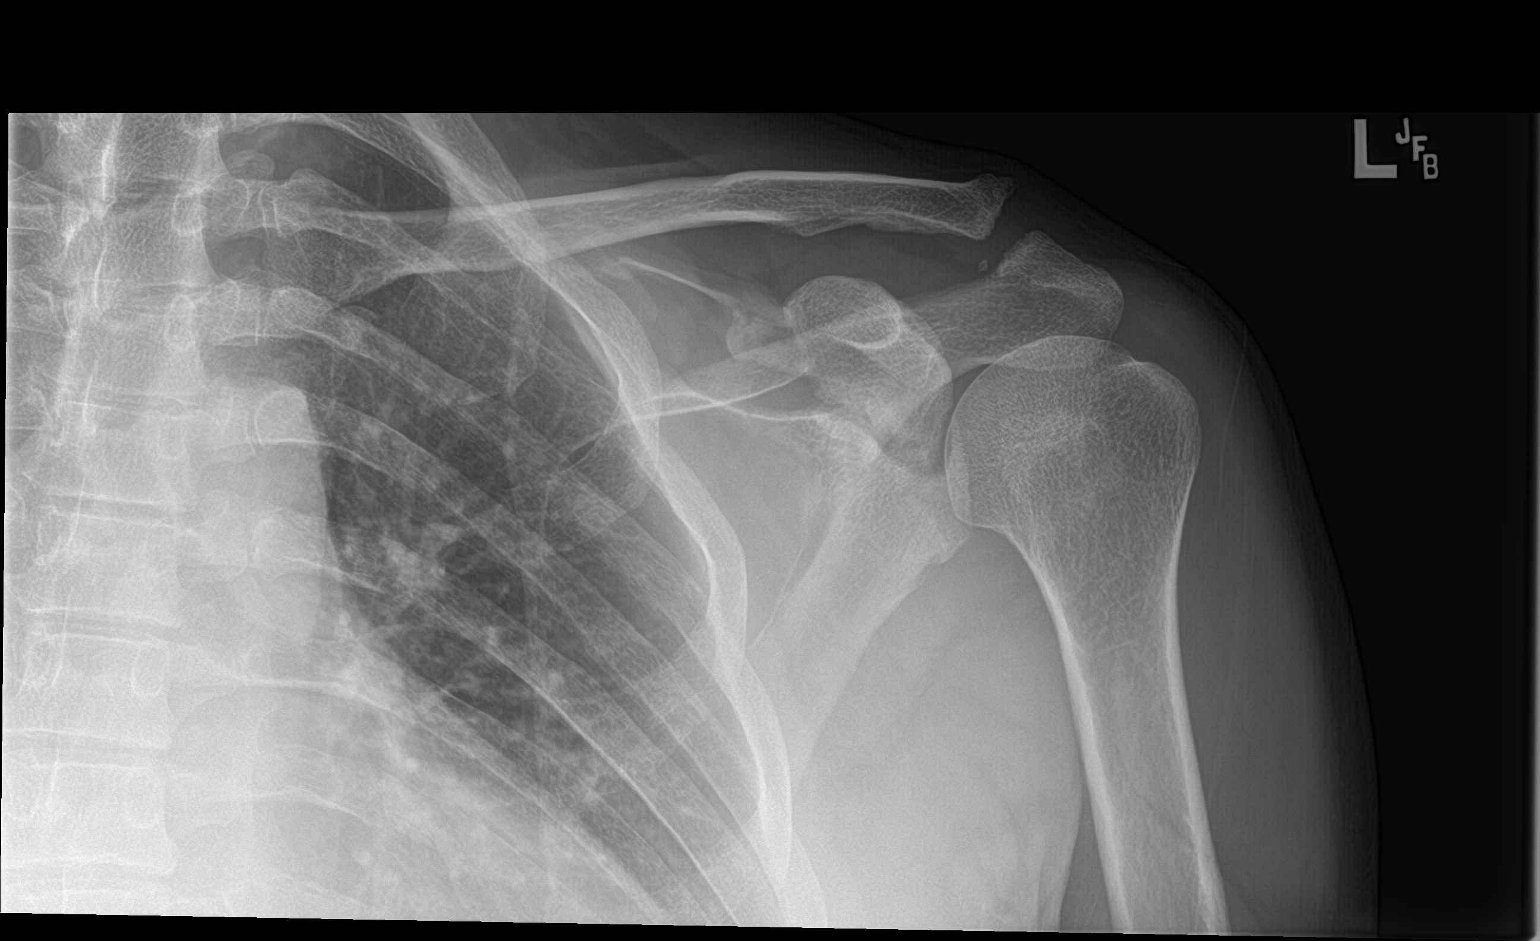

[shoulder y view]
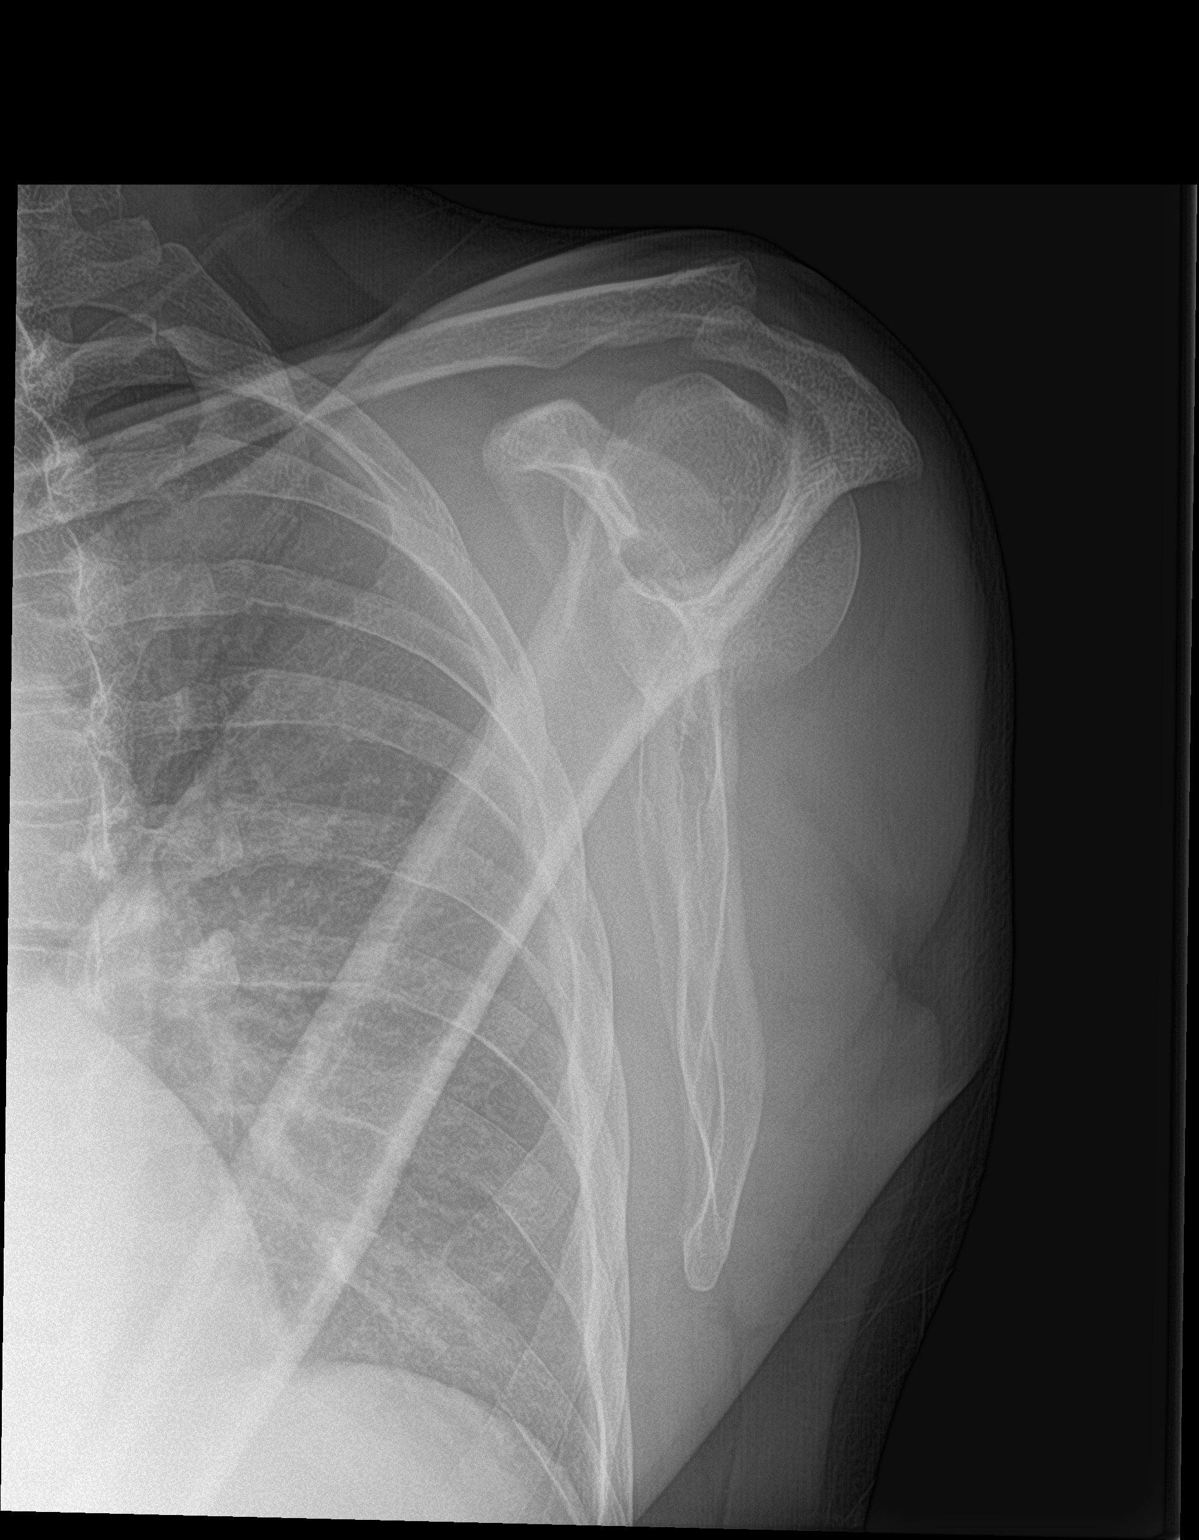

[shoulder axillary]
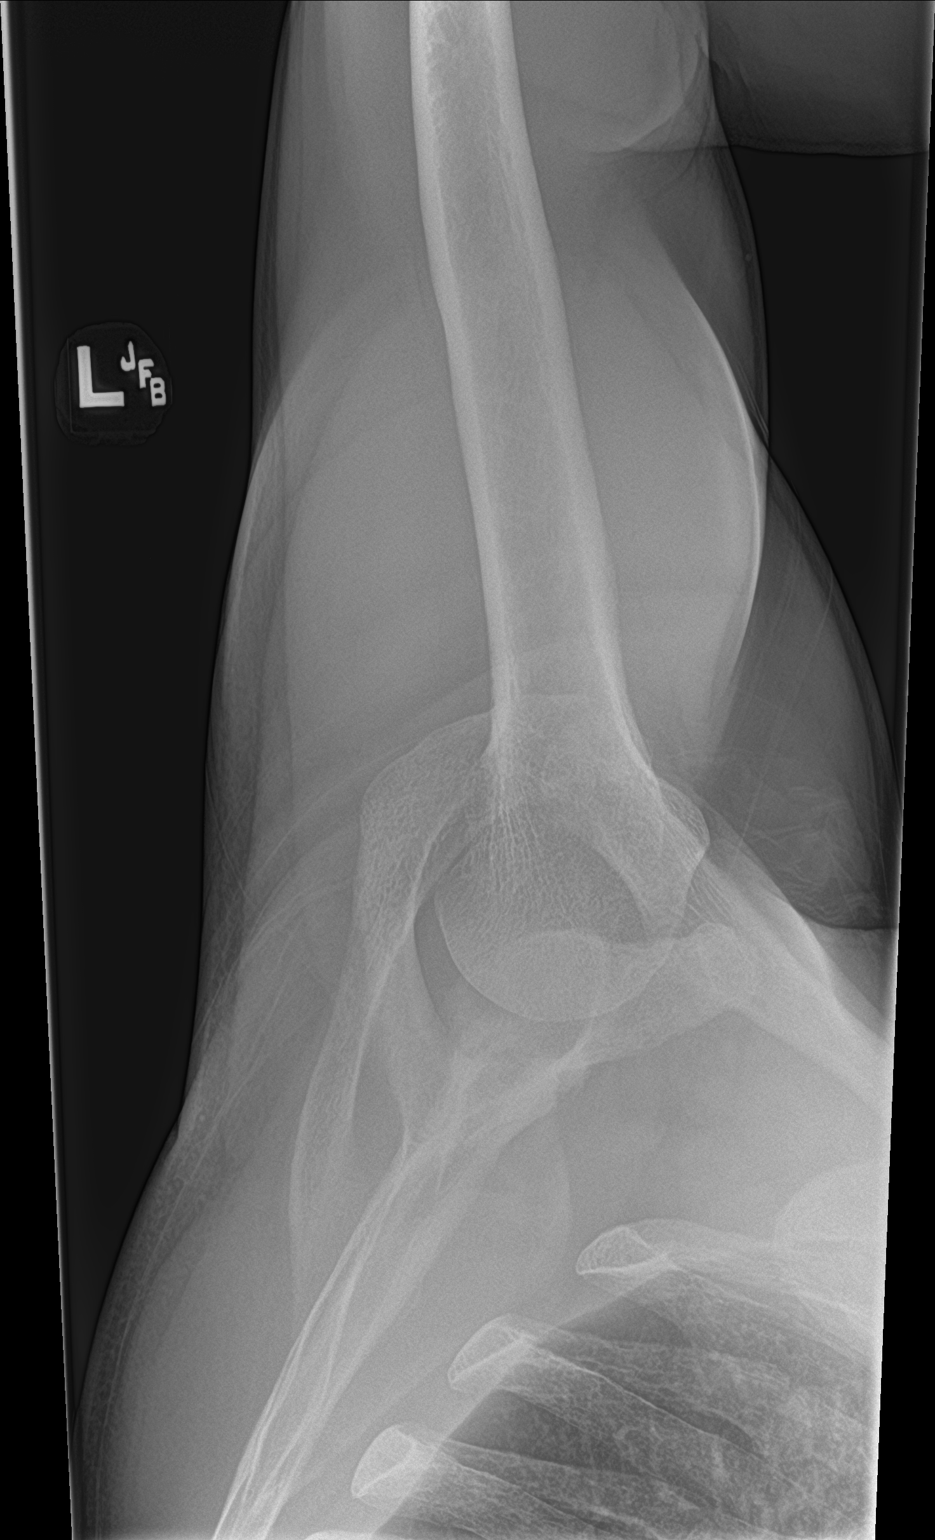

[3 of 3 positions shown; findings below may reference images not displayed]

FINDINGS: Lucency again noted in the left glenoid and superior left scapula,
unchanged since prior study. No visible healing radiographically. No
subluxation or dislocation.
IMPRESSION: Unchanged glenoid fracture without visible evidence of healing.

## 2018-11-28 ENCOUNTER — Encounter (INDEPENDENT_AMBULATORY_CARE_PROVIDER_SITE_OTHER): Payer: Self-pay

## 2020-08-27 ENCOUNTER — Other Ambulatory Visit: Payer: Self-pay | Admitting: Occupational Medicine

## 2020-08-27 ENCOUNTER — Other Ambulatory Visit: Payer: Self-pay

## 2020-08-27 ENCOUNTER — Ambulatory Visit: Payer: Self-pay

## 2020-08-27 DIAGNOSIS — M25521 Pain in right elbow: Secondary | ICD-10-CM

## 2020-10-23 ENCOUNTER — Other Ambulatory Visit: Payer: Self-pay

## 2020-10-23 ENCOUNTER — Encounter (HOSPITAL_BASED_OUTPATIENT_CLINIC_OR_DEPARTMENT_OTHER): Payer: Self-pay | Admitting: Orthopaedic Surgery

## 2020-10-25 ENCOUNTER — Encounter (HOSPITAL_BASED_OUTPATIENT_CLINIC_OR_DEPARTMENT_OTHER)
Admission: RE | Admit: 2020-10-25 | Discharge: 2020-10-25 | Disposition: A | Discharge status: Home / Self Care | Payer: No Typology Code available for payment source | Source: Ambulatory Visit | Attending: Orthopaedic Surgery | Admitting: Orthopaedic Surgery

## 2020-10-25 DIAGNOSIS — Z01812 Encounter for preprocedural laboratory examination: Secondary | ICD-10-CM | POA: Diagnosis present

## 2020-10-25 NOTE — Progress Notes (Signed)

## 2020-10-28 ENCOUNTER — Encounter (HOSPITAL_BASED_OUTPATIENT_CLINIC_OR_DEPARTMENT_OTHER)
Admission: RE | Disposition: A | Discharge status: Home / Self Care | Payer: Self-pay | Source: Ambulatory Visit | Attending: Orthopaedic Surgery

## 2020-10-28 ENCOUNTER — Other Ambulatory Visit: Payer: Self-pay

## 2020-10-28 ENCOUNTER — Ambulatory Visit (HOSPITAL_BASED_OUTPATIENT_CLINIC_OR_DEPARTMENT_OTHER): Payer: No Typology Code available for payment source | Admitting: Certified Registered"

## 2020-10-28 ENCOUNTER — Ambulatory Visit (HOSPITAL_BASED_OUTPATIENT_CLINIC_OR_DEPARTMENT_OTHER)
Admission: RE | Admit: 2020-10-28 | Discharge: 2020-10-28 | Disposition: A | Discharge status: Home / Self Care | Payer: No Typology Code available for payment source | Source: Ambulatory Visit | Attending: Orthopaedic Surgery | Admitting: Orthopaedic Surgery

## 2020-10-28 ENCOUNTER — Encounter (HOSPITAL_BASED_OUTPATIENT_CLINIC_OR_DEPARTMENT_OTHER): Payer: Self-pay | Admitting: Orthopaedic Surgery

## 2020-10-28 DIAGNOSIS — X500XXA Overexertion from strenuous movement or load, initial encounter: Secondary | ICD-10-CM | POA: Insufficient documentation

## 2020-10-28 DIAGNOSIS — E039 Hypothyroidism, unspecified: Secondary | ICD-10-CM | POA: Diagnosis not present

## 2020-10-28 DIAGNOSIS — Z79890 Hormone replacement therapy (postmenopausal): Secondary | ICD-10-CM | POA: Insufficient documentation

## 2020-10-28 DIAGNOSIS — Y9389 Activity, other specified: Secondary | ICD-10-CM | POA: Diagnosis not present

## 2020-10-28 DIAGNOSIS — Z79899 Other long term (current) drug therapy: Secondary | ICD-10-CM | POA: Diagnosis not present

## 2020-10-28 DIAGNOSIS — I10 Essential (primary) hypertension: Secondary | ICD-10-CM | POA: Diagnosis not present

## 2020-10-28 DIAGNOSIS — S46211A Strain of muscle, fascia and tendon of other parts of biceps, right arm, initial encounter: Secondary | ICD-10-CM | POA: Diagnosis present

## 2020-10-28 DIAGNOSIS — Y990 Civilian activity done for income or pay: Secondary | ICD-10-CM | POA: Insufficient documentation

## 2020-10-28 HISTORY — DX: Strain of muscle, fascia and tendon of other parts of biceps, right arm, initial encounter: S46.211A

## 2020-10-28 HISTORY — PX: DISTAL BICEPS TENDON REPAIR: SHX1461

## 2020-10-28 HISTORY — DX: Hypothyroidism, unspecified: E03.9

## 2020-10-28 SURGERY — REPAIR, TENDON, BICEPS, DISTAL
Anesthesia: General | Site: Arm Lower | Laterality: Right

## 2020-10-28 MED ORDER — BUPIVACAINE HCL (PF) 0.25 % IJ SOLN
INTRAMUSCULAR | Status: AC
  Filled 2020-10-28: qty 30

## 2020-10-28 MED ORDER — LIDOCAINE HCL (CARDIAC) PF 100 MG/5ML IV SOSY
PREFILLED_SYRINGE | INTRAVENOUS | Status: DC | PRN
  Administered 2020-10-28: 50 mg via INTRAVENOUS

## 2020-10-28 MED ORDER — ACETAMINOPHEN 500 MG PO TABS
ORAL_TABLET | ORAL | Status: AC
  Filled 2020-10-28: qty 2

## 2020-10-28 MED ORDER — LACTATED RINGERS IV SOLN: INTRAVENOUS | Status: DC

## 2020-10-28 MED ORDER — ONDANSETRON HCL 4 MG/2ML IJ SOLN
INTRAMUSCULAR | Status: DC | PRN
  Administered 2020-10-28: 4 mg via INTRAVENOUS

## 2020-10-28 MED ORDER — MEPERIDINE HCL 25 MG/ML IJ SOLN: 6.2500 mg | INTRAMUSCULAR | Status: DC | PRN

## 2020-10-28 MED ORDER — PHENYLEPHRINE 40 MCG/ML (10ML) SYRINGE FOR IV PUSH (FOR BLOOD PRESSURE SUPPORT)
PREFILLED_SYRINGE | INTRAVENOUS | Status: AC
  Filled 2020-10-28: qty 10

## 2020-10-28 MED ORDER — FENTANYL CITRATE (PF) 100 MCG/2ML IJ SOLN
INTRAMUSCULAR | Status: AC
  Filled 2020-10-28: qty 2

## 2020-10-28 MED ORDER — FENTANYL CITRATE (PF) 100 MCG/2ML IJ SOLN
100.0000 ug | Freq: Once | INTRAMUSCULAR | Status: AC
  Administered 2020-10-28: 100 ug via INTRAVENOUS

## 2020-10-28 MED ORDER — MIDAZOLAM HCL 2 MG/2ML IJ SOLN: 0.5000 mg | Freq: Once | INTRAMUSCULAR | Status: DC | PRN

## 2020-10-28 MED ORDER — VANCOMYCIN HCL 1000 MG IV SOLR
INTRAVENOUS | Status: DC | PRN
  Administered 2020-10-28: 1000 mg

## 2020-10-28 MED ORDER — MIDAZOLAM HCL 2 MG/2ML IJ SOLN
INTRAMUSCULAR | Status: AC
  Filled 2020-10-28: qty 2

## 2020-10-28 MED ORDER — BUPIVACAINE-EPINEPHRINE (PF) 0.5% -1:200000 IJ SOLN
INTRAMUSCULAR | Status: DC | PRN
  Administered 2020-10-28: 30 mL

## 2020-10-28 MED ORDER — PROMETHAZINE HCL 25 MG/ML IJ SOLN: 6.2500 mg | INTRAMUSCULAR | Status: DC | PRN

## 2020-10-28 MED ORDER — CEFAZOLIN SODIUM-DEXTROSE 2-4 GM/100ML-% IV SOLN
INTRAVENOUS | Status: AC
  Filled 2020-10-28: qty 100

## 2020-10-28 MED ORDER — 0.9 % SODIUM CHLORIDE (POUR BTL) OPTIME
TOPICAL | Status: DC | PRN
Start: 2020-10-28 — End: 2020-10-28
  Administered 2020-10-28: 1000 mL

## 2020-10-28 MED ORDER — EPHEDRINE SULFATE 50 MG/ML IJ SOLN
INTRAMUSCULAR | Status: DC | PRN
  Administered 2020-10-28: 10 mg via INTRAVENOUS
  Administered 2020-10-28: 5 mg via INTRAVENOUS

## 2020-10-28 MED ORDER — ACETAMINOPHEN 500 MG PO TABS
1000.0000 mg | ORAL_TABLET | Freq: Once | ORAL | Status: AC
  Administered 2020-10-28: 1000 mg via ORAL

## 2020-10-28 MED ORDER — OXYCODONE HCL 5 MG PO TABS: 5.0000 mg | ORAL_TABLET | Freq: Once | ORAL | Status: DC | PRN

## 2020-10-28 MED ORDER — EPHEDRINE 5 MG/ML INJ
INTRAVENOUS | Status: AC
  Filled 2020-10-28: qty 5

## 2020-10-28 MED ORDER — PROPOFOL 10 MG/ML IV BOLUS
INTRAVENOUS | Status: DC | PRN
  Administered 2020-10-28: 200 mg via INTRAVENOUS
  Administered 2020-10-28: 40 mg via INTRAVENOUS

## 2020-10-28 MED ORDER — PHENYLEPHRINE HCL (PRESSORS) 10 MG/ML IV SOLN
INTRAVENOUS | Status: DC | PRN
  Administered 2020-10-28 (×5): 80 ug via INTRAVENOUS

## 2020-10-28 MED ORDER — HYDROMORPHONE HCL 1 MG/ML IJ SOLN: 0.2500 mg | INTRAMUSCULAR | Status: DC | PRN

## 2020-10-28 MED ORDER — MIDAZOLAM HCL 2 MG/2ML IJ SOLN
2.0000 mg | Freq: Once | INTRAMUSCULAR | Status: AC
  Administered 2020-10-28: 2 mg via INTRAVENOUS

## 2020-10-28 MED ORDER — MIDAZOLAM HCL 5 MG/5ML IJ SOLN
INTRAMUSCULAR | Status: DC | PRN
  Administered 2020-10-28: 2 mg via INTRAVENOUS

## 2020-10-28 MED ORDER — OXYCODONE HCL 5 MG/5ML PO SOLN: 5.0000 mg | Freq: Once | ORAL | Status: DC | PRN

## 2020-10-28 MED ORDER — ACETAMINOPHEN 500 MG PO TABS: 1000.0000 mg | ORAL_TABLET | Freq: Three times a day (TID) | ORAL | 0 refills | Status: AC

## 2020-10-28 MED ORDER — CELECOXIB 100 MG PO CAPS: 100.0000 mg | ORAL_CAPSULE | Freq: Two times a day (BID) | ORAL | 0 refills | Status: AC

## 2020-10-28 MED ORDER — ONDANSETRON HCL 4 MG PO TABS: 4.0000 mg | ORAL_TABLET | Freq: Three times a day (TID) | ORAL | 0 refills | Status: AC | PRN

## 2020-10-28 MED ORDER — ROCURONIUM BROMIDE 100 MG/10ML IV SOLN
INTRAVENOUS | Status: DC | PRN
  Administered 2020-10-28: 50 mg via INTRAVENOUS

## 2020-10-28 MED ORDER — OXYCODONE HCL 5 MG PO TABS: ORAL_TABLET | ORAL | 0 refills | Status: AC

## 2020-10-28 MED ORDER — DEXAMETHASONE SODIUM PHOSPHATE 4 MG/ML IJ SOLN
INTRAMUSCULAR | Status: DC | PRN
  Administered 2020-10-28: 10 mg via INTRAVENOUS

## 2020-10-28 MED ORDER — FENTANYL CITRATE (PF) 100 MCG/2ML IJ SOLN
INTRAMUSCULAR | Status: DC | PRN
  Administered 2020-10-28 (×2): 50 ug via INTRAVENOUS

## 2020-10-28 MED ORDER — VANCOMYCIN HCL 1000 MG IV SOLR
INTRAVENOUS | Status: AC
  Filled 2020-10-28: qty 20

## 2020-10-28 MED ORDER — SUGAMMADEX SODIUM 200 MG/2ML IV SOLN
INTRAVENOUS | Status: DC | PRN
  Administered 2020-10-28: 200 mg via INTRAVENOUS

## 2020-10-28 MED ORDER — CEFAZOLIN SODIUM-DEXTROSE 2-4 GM/100ML-% IV SOLN
2.0000 g | INTRAVENOUS | Status: AC
  Administered 2020-10-28: 2 g via INTRAVENOUS

## 2020-10-28 SURGICAL SUPPLY — 68 items
ANCHOR SUT 1.8 FBRTK KNTLS 2SU (Anchor) ×1 IMPLANT
APL PRP STRL LF DISP 70% ISPRP (MISCELLANEOUS) ×1
BLADE SURG 10 STRL SS (BLADE) ×2 IMPLANT
BLADE SURG 15 STRL LF DISP TIS (BLADE) ×1 IMPLANT
BLADE SURG 15 STRL SS (BLADE) ×2
BNDG CMPR 9X4 STRL LF SNTH (GAUZE/BANDAGES/DRESSINGS) ×1
BNDG ELASTIC 4X5.8 VLCR STR LF (GAUZE/BANDAGES/DRESSINGS) ×3 IMPLANT
BNDG ESMARK 4X9 LF (GAUZE/BANDAGES/DRESSINGS) ×2 IMPLANT
CHLORAPREP W/TINT 26 (MISCELLANEOUS) ×2 IMPLANT
CLSR STERI-STRIP ANTIMIC 1/2X4 (GAUZE/BANDAGES/DRESSINGS) ×2 IMPLANT
CORD BIPOLAR FORCEPS 12FT (ELECTRODE) ×2 IMPLANT
CUFF TOURN SGL QUICK 18 (TOURNIQUET CUFF) ×1 IMPLANT
CUFF TOURN SGL QUICK 18X3 (MISCELLANEOUS) ×2 IMPLANT
DRAPE EXTREMITY T 121X128X90 (DISPOSABLE) ×2 IMPLANT
DRAPE INCISE IOBAN 66X45 STRL (DRAPES) IMPLANT
DRAPE OEC MINIVIEW 54X84 (DRAPES) IMPLANT
DRAPE U-SHAPE 47X51 STRL (DRAPES) ×2 IMPLANT
ELECT REM PT RETURN 9FT ADLT (ELECTROSURGICAL) ×2
ELECTRODE REM PT RTRN 9FT ADLT (ELECTROSURGICAL) ×1 IMPLANT
EXT HOSE W/PLC CONNECTION (MISCELLANEOUS) ×2
EXTENSION HOSE W/PLC CONNECTON (MISCELLANEOUS) ×1 IMPLANT
GAUZE SPONGE 4X4 12PLY STRL (GAUZE/BANDAGES/DRESSINGS) ×2 IMPLANT
GLOVE SRG 8 PF TXTR STRL LF DI (GLOVE) ×1 IMPLANT
GLOVE SURG ENC MOIS LTX SZ6.5 (GLOVE) ×2 IMPLANT
GLOVE SURG ENC MOIS LTX SZ8 (GLOVE) ×2 IMPLANT
GLOVE SURG LTX SZ8 (GLOVE) ×2 IMPLANT
GLOVE SURG UNDER POLY LF SZ6.5 (GLOVE) ×2 IMPLANT
GLOVE SURG UNDER POLY LF SZ8 (GLOVE) ×2
GOWN STRL REUS W/ TWL LRG LVL3 (GOWN DISPOSABLE) ×2 IMPLANT
GOWN STRL REUS W/TWL LRG LVL3 (GOWN DISPOSABLE) ×4
GOWN STRL REUS W/TWL XL LVL3 (GOWN DISPOSABLE) ×2 IMPLANT
KIT STR SPEAR 1.8 FBRTK DISP (KITS) ×1 IMPLANT
NS IRRIG 1000ML POUR BTL (IV SOLUTION) ×2 IMPLANT
PACK ARTHROSCOPY DSU (CUSTOM PROCEDURE TRAY) ×2 IMPLANT
PACK BASIN DAY SURGERY FS (CUSTOM PROCEDURE TRAY) ×2 IMPLANT
PAD CAST 4YDX4 CTTN HI CHSV (CAST SUPPLIES) ×2 IMPLANT
PADDING CAST COTTON 4X4 STRL (CAST SUPPLIES) ×6
PENCIL SMOKE EVACUATOR (MISCELLANEOUS) ×2 IMPLANT
SHEET MEDIUM DRAPE 40X70 STRL (DRAPES) ×2 IMPLANT
SLEEVE SCD COMPRESS KNEE MED (STOCKING) ×1 IMPLANT
SLING ARM FOAM STRAP LRG (SOFTGOODS) ×2 IMPLANT
SLING ARM FOAM STRAP XLG (SOFTGOODS) IMPLANT
SPLINT FAST PLASTER 5X30 (CAST SUPPLIES) ×10
SPLINT PLASTER CAST FAST 5X30 (CAST SUPPLIES) ×10 IMPLANT
SPONGE T-LAP 18X18 ~~LOC~~+RFID (SPONGE) ×2 IMPLANT
STAPLER VISISTAT 35W (STAPLE) IMPLANT
SUCTION FRAZIER HANDLE 10FR (MISCELLANEOUS)
SUCTION TUBE FRAZIER 10FR DISP (MISCELLANEOUS) IMPLANT
SUT FIBERWIRE #2 38 T-5 BLUE (SUTURE)
SUT FIBERWIRE #5 38 CONV NDL (SUTURE)
SUT MNCRL AB 4-0 PS2 18 (SUTURE) ×2 IMPLANT
SUT VIC AB 0 CT1 27 (SUTURE) ×2
SUT VIC AB 0 CT1 27XBRD ANBCTR (SUTURE) ×1 IMPLANT
SUT VIC AB 1 CT1 27 (SUTURE)
SUT VIC AB 1 CT1 27XBRD ANBCTR (SUTURE) IMPLANT
SUT VIC AB 2-0 CT1 27 (SUTURE)
SUT VIC AB 2-0 CT1 TAPERPNT 27 (SUTURE) IMPLANT
SUT VIC AB 2-0 SH 27 (SUTURE) ×2
SUT VIC AB 2-0 SH 27XBRD (SUTURE) ×1 IMPLANT
SUT VIC AB 3-0 SH 27 (SUTURE)
SUT VIC AB 3-0 SH 27X BRD (SUTURE) IMPLANT
SUTURE FIBERWR #2 38 T-5 BLUE (SUTURE) IMPLANT
SUTURE FIBERWR #5 38 CONV NDL (SUTURE) IMPLANT
SYR BULB EAR ULCER 3OZ GRN STR (SYRINGE) ×2 IMPLANT
SYS FBRTK BUTTON 2.6 (Anchor) ×2 IMPLANT
SYSTEM FBRTK BUTTON 2.6 (Anchor) IMPLANT
TOWEL GREEN STERILE FF (TOWEL DISPOSABLE) ×2 IMPLANT
TUBE SUCTION HIGH CAP CLEAR NV (SUCTIONS) ×2 IMPLANT

## 2020-10-28 NOTE — Transfer of Care (Signed)
Immediate Anesthesia Transfer of Care Note  Patient: Duane Kirk  Procedure(s) Performed: DISTAL BICEPS TENDON REPAIR (Right: Arm Lower)  Patient Location: PACU  Anesthesia Type:General and Regional  Level of Consciousness: drowsy  Airway & Oxygen Therapy: Patient Spontanous Breathing and Patient connected to face mask oxygen  Post-op Assessment: Report given to RN and Post -op Vital signs reviewed and stable  Post vital signs: Reviewed and stable  Last Vitals:  Vitals Value Taken Time  BP 107/61 10/28/20 1515  Temp    Pulse 93 10/28/20 1515  Resp 13 10/28/20 1515  SpO2 94 % 10/28/20 1515  Vitals shown include unvalidated device data.  Last Pain:  Vitals:   10/28/20 1225  TempSrc: Oral  PainSc: 0-No pain      Patients Stated Pain Goal: 3 (10/28/20 1225)  Complications: No notable events documented.

## 2020-10-28 NOTE — Anesthesia Preprocedure Evaluation (Addendum)
Anesthesia Evaluation  Patient identified by MRN, date of birth, ID band Patient awake    Reviewed: Allergy & Precautions, NPO status , Patient's Chart, lab work & pertinent test results, reviewed documented beta blocker date and time   History of Anesthesia Complications Negative for: history of anesthetic complications  Airway Mallampati: I  TM Distance: >3 FB Neck ROM: Full    Dental  (+) Dental Advisory Given   Pulmonary neg pulmonary ROS,    breath sounds clear to auscultation       Cardiovascular hypertension, Pt. on medications and Pt. on home beta blockers (-) angina Rhythm:Regular Rate:Normal     Neuro/Psych negative neurological ROS     GI/Hepatic negative GI ROS, Neg liver ROS,   Endo/Other  Hypothyroidism   Renal/GU negative Renal ROS     Musculoskeletal   Abdominal   Peds  Hematology negative hematology ROS (+)   Anesthesia Other Findings   Reproductive/Obstetrics                            Anesthesia Physical Anesthesia Plan  ASA: 2  Anesthesia Plan: General   Post-op Pain Management: GA combined w/ Regional for post-op pain   Induction: Intravenous  PONV Risk Score and Plan: 2 and Ondansetron and Dexamethasone  Airway Management Planned: Oral ETT  Additional Equipment: None  Intra-op Plan:   Post-operative Plan: Extubation in OR  Informed Consent: I have reviewed the patients History and Physical, chart, labs and discussed the procedure including the risks, benefits and alternatives for the proposed anesthesia with the patient or authorized representative who has indicated his/her understanding and acceptance.     Dental advisory given  Plan Discussed with: CRNA and Surgeon  Anesthesia Plan Comments: (Plan routine monitors, GA with supraclavicular block for post op analgesia)       Anesthesia Quick Evaluation

## 2020-10-28 NOTE — Anesthesia Postprocedure Evaluation (Signed)
Anesthesia Post Note  Patient: Duane Kirk  Procedure(s) Performed: DISTAL BICEPS TENDON REPAIR (Right: Arm Lower)     Patient location during evaluation: PACU Anesthesia Type: General Level of consciousness: awake Pain management: pain level controlled Vital Signs Assessment: post-procedure vital signs reviewed and stable Respiratory status: spontaneous breathing Cardiovascular status: stable Postop Assessment: no apparent nausea or vomiting Anesthetic complications: no   No notable events documented.  Last Vitals:  Vitals:   10/28/20 1535 10/28/20 1550  BP:  133/75  Pulse:  97  Resp:  16  Temp:  36.8 C  SpO2: 94% 92%    Last Pain:  Vitals:   10/28/20 1550  TempSrc:   PainSc: 0-No pain                 Chadwick Reiswig

## 2020-10-28 NOTE — Interval H&P Note (Signed)
All questions answered

## 2020-10-28 NOTE — Progress Notes (Signed)
AssistedDr. Carswell Jackson with right, ultrasound guided, supraclavicular block. Side rails up, monitors on throughout procedure. See vital signs in flow sheet. Tolerated Procedure well.  

## 2020-10-28 NOTE — Anesthesia Procedure Notes (Signed)
Procedure Name: Intubation Date/Time: 10/28/2020 2:12 PM Performed by: Willa Frater, CRNA Pre-anesthesia Checklist: Patient identified, Emergency Drugs available, Suction available and Patient being monitored Patient Re-evaluated:Patient Re-evaluated prior to induction Oxygen Delivery Method: Circle system utilized Preoxygenation: Pre-oxygenation with 100% oxygen Induction Type: IV induction Ventilation: Mask ventilation without difficulty Laryngoscope Size: Mac, 3 and Glidescope Grade View: Grade III Tube type: Oral Tube size: 7.0 mm Number of attempts: 2 Airway Equipment and Method: Stylet, Oral airway and Video-laryngoscopy Placement Confirmation: ETT inserted through vocal cords under direct vision, positive ETCO2 and breath sounds checked- equal and bilateral Secured at: 21 cm Tube secured with: Tape Dental Injury: Teeth and Oropharynx as per pre-operative assessment  Difficulty Due To: Difficulty was anticipated, Difficult Airway- due to dentition and Difficult Airway- due to reduced neck mobility

## 2020-10-28 NOTE — Discharge Instructions (Addendum)
Ramond Marrow MD, MPH Alfonse Alpers, PA-C [facility] [address] [phone] (tel)   [phone] (fax)   POST-OPERATIVE INSTRUCTIONS   WOUND CARE Please keep splint clean dry and intact until followup.  You may shower on Post-Op Day #2.  You must keep splint dry during this process and may find that a plastic bag taped around the extremity or alternatively a towel based bath may be a better option.   If you get your splint wet or if it is damaged please contact our clinic.  EXERCISES Due to your splint being in place you will not be able to bear weight through your extremity.    You may use a sling for comfort Please continue to work on range of motion of your fingers and stretch these multiple times a day to prevent stiffness. Please continue to ambulate and do not stay sitting or lying for too long. Perform foot and wrist pumps to assist in circulation.  FOLLOW-UP If you develop a Fever (>101.5), Redness or Drainage from the surgical incision site, please call our office to arrange for an evaluation. Please call the office to schedule a follow-up appointment for your incision check if you do not already have one, 7-10 days post-operatively.  REGIONAL ANESTHESIA (NERVE BLOCKS) The anesthesia team may have performed a nerve block for you if safe in the setting of your care.  This is a great tool used to minimize pain.  Typically the block may start wearing off overnight but the long acting medicine may last for 3-4 days.  The nerve block wearing off can be a challenging period but please utilize your as needed pain medications to try and manage this period.    POST-OP MEDICATIONS- Multimodal approach to pain control  In general your pain will be controlled with a combination of substances.  Prescriptions unless otherwise discussed are electronically sent to your pharmacy.  This is a carefully made plan we use to minimize narcotic use.       - Celebrex - Anti-inflammatory medication taken on a scheduled basis  - Acetaminophen - Non-narcotic pain medicine taken on a scheduled basis   - Oxycodone - This is a strong narcotic, to be used only on an "as needed" basis for SEVERE pain.             -           Zofran - take as needed for nausea   HELPFUL INFORMATION   If you had a block, it will wear off between 8-24 hrs postop typically.  This is period when your pain may go from nearly zero to the pain you would have had postop without the block.  This is an abrupt transition but nothing dangerous is happening.  You may take an extra dose of narcotic when this happens.   You may be more comfortable sleeping in a semi-seated position the first few nights following surgery.  Keep a pillow propped under the elbow and forearm for comfort.  If you have a recliner type of chair it might be beneficial.  If not that is fine too, but it would be helpful to sleep propped up with pillows behind your operated shoulder as well under your elbow and forearm.  This will reduce pulling on the suture lines.   When dressing, put your operative arm in the sleeve first.  When getting undressed, take your operative arm out last.  Loose fitting, button-down shirts are recommended.  Often  in the first days after surgery you may be more comfortable keeping your operative arm under your shirt and not through the sleeve.   You may return to work/school in the next couple of days when you feel up to it.  Desk work and typing in the sling is fine.   We suggest you use the pain medication the first night prior to going to bed, in order to ease any pain when the anesthesia wears off. You should avoid taking pain medications on an empty stomach as it will make you nauseous.   You should wean off your narcotic medicines as soon as you are able.  Most patients will be off or using minimal narcotics before their first postop appointment.    Do not drink alcoholic  beverages or take illicit drugs when taking pain medications.   It is against the law to drive while taking narcotics.  In some states it is against the law to drive while your arm is in a sling.    Pain medication may make you constipated.  Below are a few solutions to try in this order:   - Decrease the amount of pain medication if you aren't having pain.   - Drink lots of decaffeinated fluids.   - Drink prune juice and/or each dried prunes   If the first 3 don't work start with additional solutions   - Take Colace - an over-the-counter stool softener   - Take Senokot - an over-the-counter laxative   - Take Miralax - a stronger over-the-counter laxative   For more information including helpful videos and documents visit our website:   [email or web address]  Regional Anesthesia Blocks  1. Numbness or the inability to move the "blocked" extremity may last from 3-48 hours after placement. The length of time depends on the medication injected and your individual response to the medication. If the numbness is not going away after 48 hours, call your surgeon.  2. The extremity that is blocked will need to be protected until the numbness is gone and the  Strength has returned. Because you cannot feel it, you will need to take extra care to avoid injury. Because it may be weak, you may have difficulty moving it or using it. You may not know what position it is in without looking at it while the block is in effect.  3. For blocks in the legs and feet, returning to weight bearing and walking needs to be done carefully. You will need to wait until the numbness is entirely gone and the strength has returned. You should be able to move your leg and foot normally before you try and bear weight or walk. You will need someone to be with you when you first try to ensure you do not fall and possibly risk injury.  4. Bruising and tenderness at the needle site are common side  effects and will resolve in a few days.  5. Persistent numbness or new problems with movement should be communicated to the surgeon or the [facility] [phone] [facility] [phone]).   Post Anesthesia Home Care Instructions  Activity: Get plenty of rest for the remainder of the day. A responsible individual must stay with you for 24 hours following the procedure.  For the next 24 hours, DO NOT: -Drive a car -Advertising copywriter -Drink alcoholic beverages -Take any medication unless instructed by your physician -Make any legal decisions or sign important papers.  Meals: Start with liquid foods such  as gelatin or soup. Progress to regular foods as tolerated. Avoid greasy, spicy, heavy foods. If nausea and/or vomiting occur, drink only clear liquids until the nausea and/or vomiting subsides. Call your physician if vomiting continues.  Special Instructions/Symptoms: Your throat may feel dry or sore from the anesthesia or the breathing tube placed in your throat during surgery. If this causes discomfort, gargle with warm salt water. The discomfort should disappear within 24 hours.  If you had a scopolamine patch placed behind your ear for the management of post- operative nausea and/or vomiting:  1. The medication in the patch is effective for 72 hours, after which it should be removed.  Wrap patch in a tissue and discard in the trash. Wash hands thoroughly with soap and water. 2. You may remove the patch earlier than 72 hours if you experience unpleasant side effects which may include dry mouth, dizziness or visual disturbances. 3. Avoid touching the patch. Wash your hands with soap and water after contact with the patch.

## 2020-10-28 NOTE — Anesthesia Procedure Notes (Signed)
Anesthesia Regional Block: Supraclavicular block   Pre-Anesthetic Checklist: , timeout performed,  Correct Patient, Correct Site, Correct Laterality,  Correct Procedure, Correct Position, site marked,  Risks and benefits discussed,  Surgical consent,  Pre-op evaluation,  At surgeon's request and post-op pain management  Laterality: Right and Upper  Prep: chloraprep       Needles:  Injection technique: Single-shot  Needle Type: Echogenic Needle     Needle Length: 9cm  Needle Gauge: 21     Additional Needles:   Procedures:,,,, ultrasound used (permanent image in chart),,    Narrative:  Start time: 10/28/2020 1:25 PM End time: 10/28/2020 1:31 PM Injection made incrementally with aspirations every 5 mL.  Performed by: Personally  Anesthesiologist: Jairo Ben, MD  Additional Notes: Pt identified in Holding room.  Monitors applied. Working IV access confirmed. Sterile prep R clavicle and neck.  #21ga ECHOgenic Arrow block needle to supraclav brachial plexus with [place] guidacne.  30cc 0.5% Bupivacaine with 1:200k epi injected incrementally after negative test dose.  Patient asymptomatic, VSS, no heme aspirated, tolerated well.   Sandford Craze, MD

## 2020-10-28 NOTE — H&P (Signed)
PREOPERATIVE H&P  Chief Complaint: right distal bicep rupture  HPI: Duane Kirk is a 55 y.o. male who is scheduled for, Procedure(s): DISTAL BICEPS TENDON REPAIR.   Patient has a past medical history significant for HTN and hypothyroidism.   Duane Kirk is a 55 year-old seen for evaluation for right elbow injury that occurred at work when lifting a pallet for [employer] on August 27, 2020.  He felt like his right elbow had a pop or tear in it.  He had undergone previous left distal biceps rupture repair a number of years ago and had done well with this.  He had an ultrasound with Dr. Lucie Leather today which demonstrated a near full complete tear.  He continues to have pain.  His symptoms are rated as moderate to severe, and have been worsening.  This is significantly impairing activities of daily living.    Please see clinic note for further details on this patient's care.    He has elected for surgical management.   Past Medical History:  Diagnosis Date   Biceps tendon rupture, proximal, right, initial encounter    DOE (dyspnea on exertion)    ?hyperventilation syndrome   Hyperlipidemia    Hypertension    off meds   Hypothyroidism    Past Surgical History:  Procedure Laterality Date   ANTERIOR CERVICAL DECOMP/DISCECTOMY FUSION     EXCISION METACARPAL MASS Right 11/10/2013   Procedure: RIGHT INDEX AND LONG EXCISION MASS AND DEBRIDEMENT DISTAL INTERPHALANGEAL JOINT ;  Surgeon: Betha Loa, MD;  Location: [facility];  Service: Orthopedics;  Laterality: Right;   EYE SURGERY     SEPTOPLASTY     TENDON REPAIR  02/02/2006   left bicep tendon repair   Social History   Socioeconomic History   Marital status: Married    Spouse name: Not on file   Number of children: Not on file   Years of education: Not on file   Highest education level: Not on file  Occupational History   Not on file  Tobacco Use   Smoking status: Never   Smokeless tobacco: Never  Substance  and Sexual Activity   Alcohol use: Yes    Comment: occ.   Drug use: No   Sexual activity: Yes  Other Topics Concern   Not on file  Social History Narrative   1 child.   Social Determinants of Health   Financial Resource Strain: Not on file  Food Insecurity: Not on file  Transportation Needs: Not on file  Physical Activity: Not on file  Stress: Not on file  Social Connections: Not on file   Family History  Problem Relation Age of Onset   Breast cancer Mother    Gallbladder disease Mother    Lymphoma Father    No Known Allergies Prior to Admission medications   Medication Sig Start Date End Date Taking? Authorizing Provider  Ascorbic Acid 500 MG TBCR Take by mouth daily.     Yes [provider]  Coenzyme Q10 (CO Q 10) 10 MG CAPS Take by mouth.   Yes [provider]  fluticasone (FLONASE) 50 MCG/ACT nasal spray Place 2 sprays into the nose daily. 06/27/11 10/23/20 Yes Dois Davenport, MD  ibuprofen (ADVIL,MOTRIN) 200 MG tablet Take 200 mg by mouth every 6 (six) hours as needed for moderate pain.   Yes [provider]  levocetirizine (XYZAL) 5 MG tablet Take 5 mg by mouth every evening.   Yes [provider]  Multiple Vitamin (MULTIVITAMIN) capsule Take 1 capsule by mouth daily.     Yes [provider]  nebivolol (BYSTOLIC) 5 MG tablet Take 5 mg by mouth daily.   Yes [provider]  thyroid (ARMOUR) 60 MG tablet Take 60 mg by mouth daily before breakfast.   Yes [provider]  oxyCODONE-acetaminophen (PERCOCET) 5-325 MG tablet Take 1 tablet by mouth every 4 (four) hours as needed for severe pain. Patient not taking: Reported on 03/13/2016 12/30/15   Duane Kos, MD  testosterone cypionate (DEPOTESTOTERONE CYPIONATE) 100 MG/ML injection Inject 100 mg into the muscle every 14 (fourteen) days. For IM use only    [provider]    ROS: All other systems have been reviewed and were otherwise negative with the  exception of those mentioned in the HPI and as above.  Physical Exam: General: Alert, no acute distress Cardiovascular: No pedal edema Respiratory: No cyanosis, no use of accessory musculature GI: No organomegaly, abdomen is soft and non-tender Skin: No lesions in the area of chief complaint Neurologic: Sensation intact distally Psychiatric: Patient is competent for consent with normal mood and affect Lymphatic: No axillary or cervical lymphadenopathy  MUSCULOSKELETAL:  Right elbow:  He had an ultrasound with Dr. Lucie Leather today which demonstrated a near full complete tear.  He continues to have pain  Imaging: Ultrasound reviewed and demonstrates a near full thickness distal biceps injury.  Assessment: right distal bicep rupture  Plan: Plan for Procedure(s): DISTAL BICEPS TENDON REPAIR  The risks benefits and alternatives were discussed with the patient including but not limited to the risks of nonoperative treatment, versus surgical intervention including infection, bleeding, nerve injury,  blood clots, cardiopulmonary complications, morbidity, mortality, among others, and they were willing to proceed.   The patient acknowledged the explanation, agreed to proceed with the plan and consent was signed.   Operative Plan: Right distal biceps tendon repair Discharge Medications: Standard DVT Prophylaxis: None Physical Therapy: Outpatient PT Special Discharge needs: Splint. Sling for comfort   Vernetta Honey, PA-C  10/28/2020 7:47 AM

## 2020-10-29 NOTE — Op Note (Signed)
Orthopaedic Surgery Operative Note (CSN: [id number])  Duane Kirk  [birth year] Date of Surgery: 10/28/2020   Diagnoses:  Right near complete distal biceps rupture  Procedure: Right distal biceps repair   Operative Finding Successful completion of the planned procedure.  Uncomplicated surgery, we released the remaining 5 to 8% of the distal biceps fibers that there was a clear seroma.  We were then able to do a debridement and repair of the entirety of the tendon with an onlay technique.  Patient's elbow was stable to about 20 degrees short of full extension easily.  Post-operative plan: The patient will be splinted for a week.  The patient will be discharged home.  DVT prophylaxis not indicated in this ambulatory upper extremity patient without significant risk factors.   Pain control with PRN pain medication preferring oral medicines.  Follow up plan will be scheduled in approximately 7 days for incision check and XR.  Post-Op Diagnosis: Same Surgeons:Primary: Bjorn Pippin, MD Assistants:Caroline McBane PA-C Location: [facility] OR ROOM 6 Anesthesia: General with regional anesthesia Antibiotics: Ancef 2 g with local vancomycin powder 1 g at the surgical site Tourniquet time:  Total Tourniquet Time Documented: Upper Arm (Right) - 20 minutes Total: Upper Arm (Right) - 20 minutes  Estimated Blood Loss: Minimal Complications: None Specimens: None Implants: Implant Name Type Inv. Item Serial No. Manufacturer Lot No. LRB No. Used Action  ANCHOR SUT 1.8 FBRTK KNTLS 2SU - [id number] Anchor ANCHOR SUT 1.8 FBRTK KNTLS 2SU  [employer] [id number] Right 1 Implanted  SYS FBRTK BUTTON 2.6 - [id number] Anchor SYS FBRTK BUTTON 2.6  [employer] [id number] Right 1 Implanted    Indications for Surgery:   Duane Kirk is a 55 y.o. male with work-related injury resulting in a near complete distal biceps injury, patient had a similar injury in the contralateral side remotely.  Benefits and risks of  operative and nonoperative management were discussed prior to surgery with patient/guardian(s) and informed consent form was completed.  Specific risks including infection, need for additional surgery, neurovascular injury, stiffness, need for additional surgery.   Procedure:   The patient was identified properly. Informed consent was obtained and the surgical site was marked. The patient was taken up to suite where general anesthesia was induced.  The patient was positioned supine on a hand table.  The right elbow was prepped and draped in the usual sterile fashion.  Timeout was performed before the beginning of the case.  Tourniquet was used for the above duration.  We made a transverse 4 cm incision about 3 cm distal to the elbow crease and sharply incised the skin achieving hemostasis as we progressed.  We dissected the superficial subcutaneous tissue bluntly with a scissor isolating the mobilizing any superficial veins.  At this point we identified the interval between the pronator and the brachial radialis and bluntly dissected with finger dissection.  The lateral antebrachial cutaneous nerve was noted running along the radial border of the radial border of approach the approach with great care to avoid excess radially based retraction to avoid damage to this nerve.    We identified cerumen the patient had about a 90% tear of his distal biceps.  We released the remaining fibers.  We debrided any unhealthy tissue.  At this point the biceps tendon was pulled with traction using an Allis clamp and were able to mobilize it and identified that it had good excursion.    We used a #2 FiberWire style fiberloop and from proximal to  distal performed a locking Krakw type suture moving in the button.  At this point we proceeded to place blunt retractors around the radial tuberosity and maximally supinate the arm to bring the tuberosity into view.  Was cleared of soft tissue and we were able to place a 2.6  mm spade tipped wire unicortically.  We then placed a 2.6 biceps fibertak button.  We flipped it intramedullary and checked its deployment pulling back on the sutures.  We then placed a knotless 1.8 mm fibertak just 6-8 mm proximal on the tuberosity.  We checked its fixation after placement.   At that point we shuttled the fiberloop sutures through the corresponding passing sutures for the fibertak button and then were able to shuttle the tendon down to bone.  We passed one limb through the tendon and tied alternating half hitches securing the tendon.  We then used the knotless 1.8 fibertak working limb to obtain fixation 6-8 mm proximal on the tendon and were able to secure a large footprint of tendon on bone which had been previously prepared for healing.  We cut excess suture and were happy with the construct.   We irrigated the wound copiously before placing local antibiotic as listed above.  We closed the incision in a multilayer fashion with absorbable suture.  Sterile dressing was placed.  Well molded well-padded long-arm splint was placed.  Patient was awoken taken to PACU in stable condition.  Alfonse Alpers, PA-C, present and scrubbed throughout the case, critical for completion in a timely fashion, and for retraction, instrumentation, closure.

## 2020-10-31 ENCOUNTER — Encounter (HOSPITAL_BASED_OUTPATIENT_CLINIC_OR_DEPARTMENT_OTHER): Payer: Self-pay | Admitting: Orthopaedic Surgery

## 2022-02-13 ENCOUNTER — Other Ambulatory Visit: Payer: Self-pay | Admitting: Orthopedic Surgery

## 2022-03-13 ENCOUNTER — Encounter (HOSPITAL_BASED_OUTPATIENT_CLINIC_OR_DEPARTMENT_OTHER): Payer: Self-pay | Admitting: Orthopedic Surgery

## 2022-03-20 ENCOUNTER — Other Ambulatory Visit: Payer: Self-pay

## 2022-03-20 ENCOUNTER — Encounter (HOSPITAL_BASED_OUTPATIENT_CLINIC_OR_DEPARTMENT_OTHER)
Admission: RE | Disposition: A | Discharge status: Home / Self Care | Payer: Self-pay | Source: Ambulatory Visit | Attending: Orthopedic Surgery

## 2022-03-20 ENCOUNTER — Ambulatory Visit (HOSPITAL_BASED_OUTPATIENT_CLINIC_OR_DEPARTMENT_OTHER)
Admission: RE | Admit: 2022-03-20 | Discharge: 2022-03-20 | Disposition: A | Discharge status: Home / Self Care | Payer: BC Managed Care – PPO | Source: Ambulatory Visit | Attending: Orthopedic Surgery | Admitting: Orthopedic Surgery

## 2022-03-20 ENCOUNTER — Encounter (HOSPITAL_BASED_OUTPATIENT_CLINIC_OR_DEPARTMENT_OTHER): Payer: Self-pay | Admitting: Orthopedic Surgery

## 2022-03-20 ENCOUNTER — Ambulatory Visit (HOSPITAL_BASED_OUTPATIENT_CLINIC_OR_DEPARTMENT_OTHER): Payer: BC Managed Care – PPO | Admitting: Anesthesiology

## 2022-03-20 DIAGNOSIS — I10 Essential (primary) hypertension: Secondary | ICD-10-CM | POA: Diagnosis not present

## 2022-03-20 DIAGNOSIS — E039 Hypothyroidism, unspecified: Secondary | ICD-10-CM | POA: Diagnosis not present

## 2022-03-20 DIAGNOSIS — Z01818 Encounter for other preprocedural examination: Secondary | ICD-10-CM

## 2022-03-20 DIAGNOSIS — M19041 Primary osteoarthritis, right hand: Secondary | ICD-10-CM | POA: Insufficient documentation

## 2022-03-20 DIAGNOSIS — M25841 Other specified joint disorders, right hand: Secondary | ICD-10-CM | POA: Diagnosis present

## 2022-03-20 DIAGNOSIS — M67441 Ganglion, right hand: Secondary | ICD-10-CM | POA: Insufficient documentation

## 2022-03-20 HISTORY — PX: CYST EXCISION: SHX5701

## 2022-03-20 SURGERY — CYST REMOVAL
Anesthesia: Regional | Site: Index Finger | Laterality: Right

## 2022-03-20 MED ORDER — OXYCODONE HCL 5 MG/5ML PO SOLN: 5.0000 mg | Freq: Once | ORAL | Status: DC | PRN

## 2022-03-20 MED ORDER — 0.9 % SODIUM CHLORIDE (POUR BTL) OPTIME
TOPICAL | Status: DC | PRN
  Administered 2022-03-20: 100 mL

## 2022-03-20 MED ORDER — BUPIVACAINE HCL (PF) 0.25 % IJ SOLN
INTRAMUSCULAR | Status: DC | PRN
  Administered 2022-03-20: 9 mL

## 2022-03-20 MED ORDER — MIDAZOLAM HCL 5 MG/5ML IJ SOLN
INTRAMUSCULAR | Status: DC | PRN
  Administered 2022-03-20: 2 mg via INTRAVENOUS

## 2022-03-20 MED ORDER — OXYCODONE HCL 5 MG PO TABS: 5.0000 mg | ORAL_TABLET | Freq: Once | ORAL | Status: DC | PRN

## 2022-03-20 MED ORDER — MEPERIDINE HCL 25 MG/ML IJ SOLN: 6.2500 mg | INTRAMUSCULAR | Status: DC | PRN

## 2022-03-20 MED ORDER — LIDOCAINE 2% (20 MG/ML) 5 ML SYRINGE
INTRAMUSCULAR | Status: DC | PRN
  Administered 2022-03-20: 40 mg via INTRAVENOUS

## 2022-03-20 MED ORDER — FENTANYL CITRATE (PF) 100 MCG/2ML IJ SOLN
INTRAMUSCULAR | Status: AC
  Filled 2022-03-20: qty 2

## 2022-03-20 MED ORDER — PROPOFOL 10 MG/ML IV BOLUS
INTRAVENOUS | Status: DC | PRN
  Administered 2022-03-20: 40 mg via INTRAVENOUS
  Administered 2022-03-20: 20 mg via INTRAVENOUS

## 2022-03-20 MED ORDER — PROPOFOL 500 MG/50ML IV EMUL
INTRAVENOUS | Status: DC | PRN
  Administered 2022-03-20: 100 ug/kg/min via INTRAVENOUS

## 2022-03-20 MED ORDER — FENTANYL CITRATE (PF) 100 MCG/2ML IJ SOLN: 25.0000 ug | INTRAMUSCULAR | Status: DC | PRN

## 2022-03-20 MED ORDER — SULFAMETHOXAZOLE-TRIMETHOPRIM 800-160 MG PO TABS: 1.0000 | ORAL_TABLET | Freq: Two times a day (BID) | ORAL | 0 refills | Status: AC

## 2022-03-20 MED ORDER — LIDOCAINE HCL (PF) 0.5 % IJ SOLN
INTRAMUSCULAR | Status: DC | PRN
  Administered 2022-03-20: 30 mL via INTRAVENOUS

## 2022-03-20 MED ORDER — LACTATED RINGERS IV SOLN: INTRAVENOUS | Status: DC

## 2022-03-20 MED ORDER — PROMETHAZINE HCL 25 MG/ML IJ SOLN: 6.2500 mg | INTRAMUSCULAR | Status: DC | PRN

## 2022-03-20 MED ORDER — TRAMADOL HCL 50 MG PO TABS: ORAL_TABLET | ORAL | 0 refills | Status: AC

## 2022-03-20 MED ORDER — DEXAMETHASONE SODIUM PHOSPHATE 10 MG/ML IJ SOLN
INTRAMUSCULAR | Status: AC
  Filled 2022-03-20: qty 1

## 2022-03-20 MED ORDER — ONDANSETRON HCL 4 MG/2ML IJ SOLN
INTRAMUSCULAR | Status: AC
  Filled 2022-03-20: qty 2

## 2022-03-20 MED ORDER — MIDAZOLAM HCL 2 MG/2ML IJ SOLN
INTRAMUSCULAR | Status: AC
  Filled 2022-03-20: qty 2

## 2022-03-20 MED ORDER — CEFAZOLIN SODIUM-DEXTROSE 2-4 GM/100ML-% IV SOLN
2.0000 g | INTRAVENOUS | Status: AC
  Administered 2022-03-20: 2 g via INTRAVENOUS

## 2022-03-20 MED ORDER — FENTANYL CITRATE (PF) 100 MCG/2ML IJ SOLN
INTRAMUSCULAR | Status: DC | PRN
  Administered 2022-03-20: 50 ug via INTRAVENOUS

## 2022-03-20 MED ORDER — AMISULPRIDE (ANTIEMETIC) 5 MG/2ML IV SOLN: 10.0000 mg | Freq: Once | INTRAVENOUS | Status: DC | PRN

## 2022-03-20 MED ORDER — CEFAZOLIN SODIUM-DEXTROSE 2-4 GM/100ML-% IV SOLN
INTRAVENOUS | Status: AC
  Filled 2022-03-20: qty 100

## 2022-03-20 SURGICAL SUPPLY — 53 items
APL PRP STRL LF DISP 70% ISPRP (MISCELLANEOUS) ×2
APL SKNCLS STERI-STRIP NONHPOA (GAUZE/BANDAGES/DRESSINGS)
BANDAGE GAUZE 1X75IN STRL (MISCELLANEOUS) IMPLANT
BENZOIN TINCTURE PRP APPL 2/3 (GAUZE/BANDAGES/DRESSINGS) IMPLANT
BLADE MINI RND TIP GREEN BEAV (BLADE) IMPLANT
BLADE SURG 15 STRL LF DISP TIS (BLADE) ×6 IMPLANT
BLADE SURG 15 STRL SS (BLADE) ×4
BNDG CMPR 5X2 CHSV 1 LYR STRL (GAUZE/BANDAGES/DRESSINGS)
BNDG CMPR 75X11 PLY HI ABS (MISCELLANEOUS)
BNDG CMPR 75X21 PLY HI ABS (MISCELLANEOUS)
BNDG CMPR 9X4 STRL LF SNTH (GAUZE/BANDAGES/DRESSINGS) ×2
BNDG COHESIVE 1X5 TAN STRL LF (GAUZE/BANDAGES/DRESSINGS) ×1 IMPLANT
BNDG COHESIVE 2X5 TAN ST LF (GAUZE/BANDAGES/DRESSINGS) IMPLANT
BNDG ELASTIC 2X5.8 VLCR STR LF (GAUZE/BANDAGES/DRESSINGS) IMPLANT
BNDG ELASTIC 3X5.8 VLCR STR LF (GAUZE/BANDAGES/DRESSINGS) IMPLANT
BNDG ESMARK 4X9 LF (GAUZE/BANDAGES/DRESSINGS) ×1 IMPLANT
BNDG GAUZE 1X75IN STRL (MISCELLANEOUS)
BNDG GAUZE DERMACEA FLUFF 4 (GAUZE/BANDAGES/DRESSINGS) IMPLANT
BNDG GZE DERMACEA 4 6PLY (GAUZE/BANDAGES/DRESSINGS)
BNDG PLASTER X FAST 3X3 WHT LF (CAST SUPPLIES) IMPLANT
BNDG PLSTR 9X3 FST ST WHT (CAST SUPPLIES)
CHLORAPREP W/TINT 26 (MISCELLANEOUS) ×3 IMPLANT
CORD BIPOLAR FORCEPS 12FT (ELECTRODE) ×3 IMPLANT
COVER BACK TABLE 60X90IN (DRAPES) ×3 IMPLANT
COVER MAYO STAND STRL (DRAPES) ×3 IMPLANT
CUFF TOURN SGL QUICK 18X4 (TOURNIQUET CUFF) ×3 IMPLANT
DRAPE EXTREMITY T 121X128X90 (DISPOSABLE) ×3 IMPLANT
DRAPE SURG 17X23 STRL (DRAPES) ×3 IMPLANT
GAUZE SPONGE 4X4 12PLY STRL (GAUZE/BANDAGES/DRESSINGS) ×3 IMPLANT
GAUZE STRETCH 2X75IN STRL (MISCELLANEOUS) IMPLANT
GAUZE XEROFORM 1X8 LF (GAUZE/BANDAGES/DRESSINGS) ×3 IMPLANT
GLOVE BIO SURGEON STRL SZ7.5 (GLOVE) ×3 IMPLANT
GLOVE BIOGEL PI IND STRL 8 (GLOVE) ×3 IMPLANT
GOWN STRL REUS W/ TWL LRG LVL3 (GOWN DISPOSABLE) ×3 IMPLANT
GOWN STRL REUS W/TWL LRG LVL3 (GOWN DISPOSABLE) ×2
NDL HYPO 25X1 1.5 SAFETY (NEEDLE) ×2 IMPLANT
NEEDLE HYPO 25X1 1.5 SAFETY (NEEDLE) ×2 IMPLANT
NS IRRIG 1000ML POUR BTL (IV SOLUTION) ×3 IMPLANT
PACK BASIN DAY SURGERY FS (CUSTOM PROCEDURE TRAY) ×3 IMPLANT
PAD CAST 3X4 CTTN HI CHSV (CAST SUPPLIES) IMPLANT
PAD CAST 4YDX4 CTTN HI CHSV (CAST SUPPLIES) IMPLANT
PADDING CAST ABS COTTON 4X4 ST (CAST SUPPLIES) ×2 IMPLANT
PADDING CAST COTTON 3X4 STRL (CAST SUPPLIES)
PADDING CAST COTTON 4X4 STRL (CAST SUPPLIES)
SPLINT FINGER 3.25 911903 (SOFTGOODS) ×1 IMPLANT
STOCKINETTE 4X48 STRL (DRAPES) ×3 IMPLANT
STRIP CLOSURE SKIN 1/2X4 (GAUZE/BANDAGES/DRESSINGS) IMPLANT
SUT ETHILON 3 0 PS 1 (SUTURE) IMPLANT
SUT ETHILON 4 0 PS 2 18 (SUTURE) ×3 IMPLANT
SYR BULB EAR ULCER 3OZ GRN STR (SYRINGE) ×3 IMPLANT
SYR CONTROL 10ML LL (SYRINGE) ×3 IMPLANT
TOWEL GREEN STERILE FF (TOWEL DISPOSABLE) ×6 IMPLANT
UNDERPAD 30X36 HEAVY ABSORB (UNDERPADS AND DIAPERS) ×3 IMPLANT

## 2022-03-20 NOTE — H&P (Signed)
  Duane Kirk is an 57 y.o. male.   Chief Complaint: mucoid cyst HPI: 57 yo male with mucoid cyst right index finger.  It is bothersome to him.  He wishes to have it removed and the dip joint debrided to try to prevent recurrence.  Allergies: No Known Allergies  Past Medical History:  Diagnosis Date   Biceps tendon rupture, proximal, right, initial encounter    DOE (dyspnea on exertion)    ?hyperventilation syndrome   Hyperlipidemia    Hypertension    off meds   Hypothyroidism     Past Surgical History:  Procedure Laterality Date   ANTERIOR CERVICAL DECOMP/DISCECTOMY FUSION     DISTAL BICEPS TENDON REPAIR Right 10/28/2020   Procedure: DISTAL BICEPS TENDON REPAIR;  Surgeon: Hiram Gash, MD;  Location: [place];  Service: Orthopedics;  Laterality: Right;   EXCISION METACARPAL MASS Right 11/10/2013   Procedure: RIGHT INDEX AND LONG EXCISION MASS AND DEBRIDEMENT DISTAL INTERPHALANGEAL JOINT ;  Surgeon: Leanora Cover, MD;  Location: [place];  Service: Orthopedics;  Laterality: Right;   EYE SURGERY     SEPTOPLASTY     TENDON REPAIR  02/02/2006   left bicep tendon repair    Family History: Family History  Problem Relation Age of Onset   Breast cancer Mother    Gallbladder disease Mother    Lymphoma Father     Social History:   reports that he has never smoked. He has never used smokeless tobacco. He reports current alcohol use. He reports that he does not use drugs.  Medications: Medications Prior to Admission  Medication Sig Dispense Refill   Ascorbic Acid 500 MG TBCR Take by mouth daily.       Coenzyme Q10 (CO Q 10) 10 MG CAPS Take by mouth.     nebivolol (BYSTOLIC) 5 MG tablet Take 5 mg by mouth daily.     testosterone cypionate (DEPOTESTOTERONE CYPIONATE) 100 MG/ML injection Inject 100 mg into the muscle every 14 (fourteen) days. For IM use only     thyroid (ARMOUR) 60 MG tablet Take 60 mg by mouth daily before breakfast.      fluticasone (FLONASE) 50 MCG/ACT nasal spray Place 2 sprays into the nose daily. 1 g 6    No results found for this or any previous visit (from the past 48 hour(s)).  No results found.    Blood pressure (!) 128/92, pulse 88, temperature 97.7 F (36.5 C), temperature source Oral, resp. rate 15, height 5' 9"$  (1.753 m), weight 71.8 kg, SpO2 100 %.  General appearance: alert, cooperative, and appears stated age Head: Normocephalic, without obvious abnormality, atraumatic Neck: supple, symmetrical, trachea midline Extremities: Intact sensation and capillary refill all digits.  +epl/fpl/io.  No wounds.  Pulses: 2+ and symmetric Skin: Skin color, texture, turgor normal. No rashes or lesions Neurologic: Grossly normal Incision/Wound: none  Assessment/Plan Right index finger mucoid cyst.  Non operative and operative treatment options have been discussed with the patient and patient wishes to proceed with operative treatment. Risks, benefits, and alternatives of surgery have been discussed and the patient agrees with the plan of care.   Leanora Cover 03/20/2022, 12:57 PM

## 2022-03-20 NOTE — Op Note (Signed)
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  Right index finger excision of mucoid cyst 2.  Right index finger debridement of DIP joint   SURGEON:  Leanora Cover, M.D.   ASSISTANT: none   ANESTHESIA:  Bier block with sedation   INTRAVENOUS FLUIDS:  Per anesthesia flow sheet.   ESTIMATED BLOOD LOSS:  Minimal.   COMPLICATIONS:  None.   SPECIMENS: Right index finger mucoid cyst to pathology   TOURNIQUET TIME:    Total Tourniquet Time Documented: Forearm (Right) - 28 minutes Total: Forearm (Right) - 28 minutes    DISPOSITION:  Stable to PACU.   INDICATIONS: 57 year old male with mucoid cyst of right index hyperspace  He wishes to have it removed and the DIP joint debrided to try to prevent recurrence.  Risks, benefits and alternatives of surgery were discussed including the risks of blood loss, infection, damage to nerves, vessels, tendons, ligaments, bone for surgery, need for additional surgery, complications with wound healing, continued pain, stiffness, , recurrence.  He voiced understanding of these risks and elected to proceed.  OPERATIVE COURSE:  After being identified preoperatively by myself,  the patient and I agreed on the procedure and site of the procedure.  The surgical site was marked.  Surgical consent had been signed. Preoperative IV antibiotic prophylaxis was given. He was transferred to the operating room and placed on the operating table in supine position with the Right upper extremity on an arm board.  Bier block anesthesia was induced by the anesthesiologist.  Right upper extremity was prepped  and draped in normal sterile orthopedic fashion.  A surgical pause was performed between the surgeons, anesthesia, and operating room staff and all were in agreement as to the patient, procedure, and site of procedure.  Tourniquet at the proximal aspect of the forearm had been inflated for the Bier block.  A digital block was performed with quarter percent plain Marcaine to aid in postoperative analgesia.  A hockey-stick shaped incision was made at the DIP joint of the index finger adjacent to the ruptured cyst.  This was carried in subcutaneous tissues by spreading technique.  The cyst was identified.  It was carefully freed up and removed.  Was sent to pathology for examination.  The ruptured edge of the cyst at the skin was sharply removed with the scissors.  The DIP joint was entered underneath the extensor tendon.  The synovectomy rongeurs were used to debride the joint.  There was prominent bone at the dorsal radial aspect of the DIP joint.  This was taken down.  The synovectomy rongeurs were used to remove any remaining portion of the cyst in the subcutaneous tissues.  This was sent to pathology for examination.  The wound and joint were copiously irrigated with sterile saline.  The wound was closed with 4-0 nylon in a horizontal mattress fashion.  The removed portion of the skin from the cyst was able to be primarily closed.  The wound was dressed with sterile Xeroform and 4 x 4 and an AlumaFoam splint placed.  This was wrapped with a Coban dressing lightly.  The tourniquet was deflated at T8 minutes.  Fingertips  were pink with brisk capillary refill after deflation of tourniquet.  The operative  drapes were broken down.  The patient was awoken from anesthesia safely.  He was transferred back to the stretcher and taken to PACU in stable condition.  I will see him back in the office in 1 week for postoperative followup.  I will give him a prescription for Tramadol 50 mg 1-2 tabs PO q6 hours prn pain, dispense  # 20 and Bactrim DS 1 p.o. twice daily x 7 days to cover for the ruptured cyst.   Leanora Cover, MD Electronically signed, 03/20/22

## 2022-03-20 NOTE — Anesthesia Preprocedure Evaluation (Addendum)
Anesthesia Evaluation  Patient identified by MRN, date of birth, ID band Patient awake    Reviewed: Allergy & Precautions, NPO status , Patient's Chart, lab work & pertinent test results, reviewed documented beta blocker date and time   History of Anesthesia Complications Negative for: history of anesthetic complications  Airway Mallampati: I  TM Distance: >3 FB Neck ROM: Full    Dental  (+) Dental Advisory Given   Pulmonary neg pulmonary ROS   breath sounds clear to auscultation       Cardiovascular hypertension, Pt. on medications and Pt. on home beta blockers (-) angina + DOE   Rhythm:Regular Rate:Normal     Neuro/Psych negative neurological ROS     GI/Hepatic negative GI ROS, Neg liver ROS,,,  Endo/Other  Hypothyroidism    Renal/GU negative Renal ROS     Musculoskeletal  (+) Arthritis ,    Abdominal   Peds  Hematology negative hematology ROS (+)   Anesthesia Other Findings   Reproductive/Obstetrics                             Anesthesia Physical Anesthesia Plan  ASA: 2  Anesthesia Plan: Bier Block and Bier Block-Lidocaine Only   Post-op Pain Management: GA combined w/ Regional for post-op pain and Tylenol PO (pre-op)* and Celebrex PO (pre-op)*   Induction: Intravenous  PONV Risk Score and Plan: 2 and Ondansetron, Dexamethasone, Midazolam and Treatment may vary due to age or medical condition  Airway Management Planned:   Additional Equipment: None  Intra-op Plan:   Post-operative Plan:   Informed Consent: I have reviewed the patients History and Physical, chart, labs and discussed the procedure including the risks, benefits and alternatives for the proposed anesthesia with the patient or authorized representative who has indicated his/her understanding and acceptance.     Dental advisory given  Plan Discussed with: CRNA  Anesthesia Plan Comments:          Anesthesia Quick Evaluation

## 2022-03-20 NOTE — Discharge Instructions (Addendum)

## 2022-03-20 NOTE — Anesthesia Procedure Notes (Signed)
Anesthesia Regional Block: Bier block (IV Regional)   Pre-Anesthetic Checklist: , timeout performed,  Correct Patient, Correct Site, Correct Laterality,  Correct Procedure,, site marked,  Surgical consent,  At surgeon's request  Laterality: Right         Needles:  Injection technique: Single-shot  Needle Type: Other      Needle Gauge: 22     Additional Needles:   Procedures:,,,,, intact distal pulses, Esmarch exsanguination,  Single tourniquet utilized    Narrative:  Start time: 03/20/2022 1:18 PM End time: 03/20/2022 3:18 AM  Performed by: Personally

## 2022-03-20 NOTE — Transfer of Care (Signed)
Immediate Anesthesia Transfer of Care Note  Patient: Duane Kirk  Procedure(s) Performed: RIGHT INDEX FINGER EXCISION MUCOID CYST AND DEBRIDEMENT DISTAL INTERPHALANGEAL JOINT (Right: Index Finger)  Patient Location: PACU  Anesthesia Type:MAC and Bier block  Level of Consciousness: awake, alert , and oriented  Airway & Oxygen Therapy: Patient Spontanous Breathing  Post-op Assessment: Report given to RN and Post -op Vital signs reviewed and stable  Post vital signs: Reviewed and stable  Last Vitals:  Vitals Value Taken Time  BP 121/85 03/20/22 1353  Temp    Pulse 64 03/20/22 1354  Resp 11 03/20/22 1354  SpO2 96 % 03/20/22 1354  Vitals shown include unvalidated device data.  Last Pain:  Vitals:   03/20/22 1150  TempSrc: Oral  PainSc: 0-No pain         Complications: No notable events documented.

## 2022-03-21 NOTE — Anesthesia Postprocedure Evaluation (Signed)
Anesthesia Post Note  Patient: Duane Kirk  Procedure(s) Performed: RIGHT INDEX FINGER EXCISION MUCOID CYST AND DEBRIDEMENT DISTAL INTERPHALANGEAL JOINT (Right: Index Finger)     Patient location during evaluation: PACU Anesthesia Type: Bier Block Level of consciousness: awake and alert Pain management: pain level controlled Vital Signs Assessment: post-procedure vital signs reviewed and stable Respiratory status: spontaneous breathing, nonlabored ventilation, respiratory function stable and patient connected to nasal cannula oxygen Cardiovascular status: stable and blood pressure returned to baseline Postop Assessment: no apparent nausea or vomiting Anesthetic complications: no   No notable events documented.  Last Vitals:  Vitals:   03/20/22 1415 03/20/22 1438  BP: 120/82 126/86  Pulse: (!) 58 (!) 52  Resp: 12 20  Temp:  36.8 C  SpO2: 94% 100%    Last Pain:  Vitals:   03/20/22 1438  TempSrc: Oral  PainSc: 0-No pain                 Nolon Nations

## 2022-03-23 ENCOUNTER — Encounter (HOSPITAL_BASED_OUTPATIENT_CLINIC_OR_DEPARTMENT_OTHER): Payer: Self-pay | Admitting: Orthopedic Surgery

## 2022-03-23 LAB — SURGICAL PATHOLOGY

## 2022-10-07 ENCOUNTER — Other Ambulatory Visit: Payer: Self-pay

## 2022-10-07 ENCOUNTER — Telehealth (HOSPITAL_COMMUNITY): Payer: Self-pay | Admitting: Emergency Medicine

## 2022-10-07 ENCOUNTER — Emergency Department (HOSPITAL_COMMUNITY): Payer: BC Managed Care – PPO

## 2022-10-07 ENCOUNTER — Emergency Department (HOSPITAL_COMMUNITY)
Admission: EM | Admit: 2022-10-07 | Discharge: 2022-10-07 | Disposition: A | Discharge status: Home / Self Care | Payer: No Typology Code available for payment source | Attending: Emergency Medicine | Admitting: Emergency Medicine

## 2022-10-07 DIAGNOSIS — S62607A Fracture of unspecified phalanx of left little finger, initial encounter for closed fracture: Secondary | ICD-10-CM

## 2022-10-07 DIAGNOSIS — Z23 Encounter for immunization: Secondary | ICD-10-CM | POA: Diagnosis not present

## 2022-10-07 DIAGNOSIS — Y990 Civilian activity done for income or pay: Secondary | ICD-10-CM | POA: Insufficient documentation

## 2022-10-07 DIAGNOSIS — W232XXA Caught, crushed, jammed or pinched between a moving and stationary object, initial encounter: Secondary | ICD-10-CM | POA: Diagnosis not present

## 2022-10-07 DIAGNOSIS — S61217A Laceration without foreign body of left little finger without damage to nail, initial encounter: Secondary | ICD-10-CM | POA: Diagnosis present

## 2022-10-07 DIAGNOSIS — S61317A Laceration without foreign body of left little finger with damage to nail, initial encounter: Secondary | ICD-10-CM

## 2022-10-07 DIAGNOSIS — S62637A Displaced fracture of distal phalanx of left little finger, initial encounter for closed fracture: Secondary | ICD-10-CM | POA: Insufficient documentation

## 2022-10-07 MED ORDER — HYDROCODONE-ACETAMINOPHEN 5-325 MG PO TABS: 1.0000 | ORAL_TABLET | ORAL | 0 refills | Status: AC | PRN

## 2022-10-07 MED ORDER — TETANUS-DIPHTH-ACELL PERTUSSIS 5-2.5-18.5 LF-MCG/0.5 IM SUSY
0.5000 mL | PREFILLED_SYRINGE | Freq: Once | INTRAMUSCULAR | Status: AC
  Administered 2022-10-07: 0.5 mL via INTRAMUSCULAR
  Filled 2022-10-07: qty 0.5

## 2022-10-07 MED ORDER — HYDROCODONE-ACETAMINOPHEN 5-325 MG PO TABS
2.0000 | ORAL_TABLET | Freq: Once | ORAL | Status: AC
  Administered 2022-10-07: 2 via ORAL
  Filled 2022-10-07: qty 2

## 2022-10-07 MED ORDER — HYDROCODONE-ACETAMINOPHEN 5-325 MG PO TABS: 1.0000 | ORAL_TABLET | ORAL | 0 refills | Status: DC | PRN

## 2022-10-07 NOTE — ED Notes (Addendum)
Wound care performed on L little finger. Xeroform gauze placed, splint applied over. Tolerated well. AVS with prescriptions provided to and discussed with patient and family member at bedside. Pt verbalizes understanding of discharge instructions and denies any questions or concerns at this time. Pt has ride home. Pt ambulated out of department independently with steady gait.

## 2022-10-07 NOTE — Consult Note (Signed)
Reason for Consult:Left little finger injury Referring Physician: Carmell Austria Time called: 1327 Time at bedside: 1327   Duane Kirk is an 57 y.o. male.  HPI: Duane Kirk got his left little finger caught between a pole and something he was moving. He suffered an injury and was brought to the ED and hand surgery was consulted. X-rays showed a distal tuft fx.  Past Medical History:  Diagnosis Date   Biceps tendon rupture, proximal, right, initial encounter    DOE (dyspnea on exertion)    ?hyperventilation syndrome   Hyperlipidemia    Hypertension    off meds   Hypothyroidism     Past Surgical History:  Procedure Laterality Date   ANTERIOR CERVICAL DECOMP/DISCECTOMY FUSION     CYST EXCISION Right 03/20/2022   Procedure: RIGHT INDEX FINGER EXCISION MUCOID CYST AND DEBRIDEMENT DISTAL INTERPHALANGEAL JOINT;  Surgeon: Betha Loa, MD;  Location: [facility];  Service: Orthopedics;  Laterality: Right;  30 MIN   DISTAL BICEPS TENDON REPAIR Right 10/28/2020   Procedure: DISTAL BICEPS TENDON REPAIR;  Surgeon: Bjorn Pippin, MD;  Location: [facility];  Service: Orthopedics;  Laterality: Right;   EXCISION METACARPAL MASS Right 11/10/2013   Procedure: RIGHT INDEX AND LONG EXCISION MASS AND DEBRIDEMENT DISTAL INTERPHALANGEAL JOINT ;  Surgeon: Betha Loa, MD;  Location: [facility];  Service: Orthopedics;  Laterality: Right;   EYE SURGERY     SEPTOPLASTY     TENDON REPAIR  02/02/2006   left bicep tendon repair    Family History  Problem Relation Age of Onset   Breast cancer Mother    Gallbladder disease Mother    Lymphoma Father     Social History:  reports that he has never smoked. He has never used smokeless tobacco. He reports current alcohol use. He reports that he does not use drugs.  Allergies: No Known Allergies  Medications: I have reviewed the patient's current medications.  No results found for this or any previous visit (from  the past 48 hour(s)).  DG Finger Little Left  Result Date: 10/07/2022 CLINICAL DATA:  Injury.  Pain. EXAM: LEFT FINGER(S) - 2+ VIEW COMPARISON:  None Available. FINDINGS: Minimally displaced distal tuft fracture of the pinky finger. No intra-articular involvement. Proximal digit is intact. A dressing overlies the mid and distal digit with mild skin irregularity distally. No radiopaque foreign body. IMPRESSION: Minimally displaced distal tuft fracture of the pinky finger. Electronically Signed   By: Narda Rutherford M.D.   On: 10/07/2022 12:23    Review of Systems  HENT:  Negative for ear discharge, ear pain, hearing loss and tinnitus.   Eyes:  Negative for photophobia and pain.  Respiratory:  Negative for cough and shortness of breath.   Cardiovascular:  Negative for chest pain.  Gastrointestinal:  Negative for abdominal pain, nausea and vomiting.  Genitourinary:  Negative for dysuria, flank pain, frequency and urgency.  Musculoskeletal:  Positive for arthralgias (Left little finger). Negative for back pain, myalgias and neck pain.  Neurological:  Negative for dizziness and headaches.  Hematological:  Does not bruise/bleed easily.  Psychiatric/Behavioral:  The patient is not nervous/anxious.    Blood pressure (!) 150/88, pulse 88, temperature 98.1 F (36.7 C), temperature source Oral, resp. rate 16, height 5\' 9"  (1.753 m), weight 77.1 kg, SpO2 99%. Physical Exam Constitutional:      General: He is not in acute distress.    Appearance: He is well-developed. He is not diaphoretic.  HENT:  Head: Normocephalic and atraumatic.  Eyes:     General: No scleral icterus.       Right eye: No discharge.        Left eye: No discharge.     Conjunctiva/sclera: Conjunctivae normal.  Cardiovascular:     Rate and Rhythm: Normal rate and regular rhythm.  Pulmonary:     Effort: Pulmonary effort is normal. No respiratory distress.  Musculoskeletal:     Cervical back: Normal range of motion.      Comments: Left shoulder, elbow, wrist, digits- Avulsion pad of little finger with distal transverse lac, mod TTP, no instability, no blocks to motion  Sens  Ax/R/M/U intact  Mot   Ax/ R/ PIN/ M/ AIN/ U intact  Rad 2+  Skin:    General: Skin is warm and dry.  Neurological:     Mental Status: He is alert.  Psychiatric:        Mood and Affect: Mood normal.        Behavior: Behavior normal.     Assessment/Plan: Left little finger injury -- Will treat with Xeroform dressing and abx. F/u with Dr. Frazier Butt next week.    Freeman Caldron, PA-C Orthopedic Surgery [phone] 10/07/2022, 2:15 PM

## 2022-10-07 NOTE — ED Triage Notes (Signed)
Pt reports smashing left pinky on a cart at work. Bleeding controlled in triage.

## 2022-10-07 NOTE — Telephone Encounter (Signed)
Patient requested medications to different pharmacy.  This was accommodated.

## 2022-10-07 NOTE — ED Provider Notes (Signed)
[facility] AT [facility] Provider Note   CSN: [id number] Arrival date & time: 10/07/22  1120     History  Chief Complaint  Patient presents with   Laceration    Duane Kirk is a 57 y.o. male.  Patient reports he got his finger caught between a pole and a piece of metal at work.  Patient complains that the skin was pulled away from his finger.  The history is provided by the patient. No language interpreter was used.  Laceration Location:  Hand and finger Finger laceration location:  L little finger Length:  1.3 Depth:  Through dermis Quality: straight   Time since incident:  5 hours Laceration mechanism:  Metal edge Pain details:    Quality:  Aching Foreign body present:  No foreign bodies Relieved by:  Nothing Worsened by:  Nothing Tetanus status:  Out of date      Home Medications Prior to Admission medications   Medication Sig Start Date End Date Taking? Authorizing Provider  HYDROcodone-acetaminophen (NORCO/VICODIN) 5-325 MG tablet Take 1 tablet by mouth every 4 (four) hours as needed for moderate pain. 10/07/22 10/07/23 Yes Elson Areas, PA-C  Ascorbic Acid 500 MG TBCR Take by mouth daily.      [provider]  Coenzyme Q10 (CO Q 10) 10 MG CAPS Take by mouth.    [provider]  fluticasone (FLONASE) 50 MCG/ACT nasal spray Place 2 sprays into the nose daily. 06/27/11 10/23/20  Dois Davenport, MD  nebivolol (BYSTOLIC) 5 MG tablet Take 5 mg by mouth daily.    [provider]  sulfamethoxazole-trimethoprim (BACTRIM DS) 800-160 MG tablet Take 1 tablet by mouth 2 (two) times daily. 03/20/22   Betha Loa, MD  testosterone cypionate (DEPOTESTOTERONE CYPIONATE) 100 MG/ML injection Inject 100 mg into the muscle every 14 (fourteen) days. For IM use only    [provider]  thyroid (ARMOUR) 60 MG tablet Take 60 mg by mouth daily before breakfast.    [provider]  traMADol (ULTRAM) 50 MG  tablet 1-2 tabs PO q6 hours prn pain 03/20/22   Betha Loa, MD      Allergies    Patient has no known allergies.    Review of Systems   Review of Systems  All other systems reviewed and are negative.   Physical Exam Updated Vital Signs BP (!) 154/91 (BP Location: Right Arm)   Pulse 89   Temp 98 F (36.7 C) (Oral)   Resp 19   Ht 5\' 9"  (1.753 m)   Wt 77.1 kg   SpO2 99%   BMI 25.10 kg/m  Physical Exam Vitals reviewed.  Constitutional:      Appearance: Normal appearance.  Cardiovascular:     Rate and Rhythm: Normal rate.  Pulmonary:     Effort: Pulmonary effort is normal.  Musculoskeletal:        General: Normal range of motion.  Skin:    Comments: 1.3 x 0.8 cm laceration palmar aspect distal fifth phalanx.  Skin completely avulsed.  No bone exposed neurovascular neurosensory are intact  Neurological:     General: No focal deficit present.     Mental Status: He is alert.  Psychiatric:        Mood and Affect: Mood normal.     ED Results / Procedures / Treatments   Labs (all labs ordered are listed, but only abnormal results are displayed) Labs Reviewed - No data to display  EKG None  Radiology DG Finger Little Left  Result Date: 10/07/2022 CLINICAL DATA:  Injury.  Pain. EXAM: LEFT FINGER(S) - 2+ VIEW COMPARISON:  None Available. FINDINGS: Minimally displaced distal tuft fracture of the pinky finger. No intra-articular involvement. Proximal digit is intact. A dressing overlies the mid and distal digit with mild skin irregularity distally. No radiopaque foreign body. IMPRESSION: Minimally displaced distal tuft fracture of the pinky finger. Electronically Signed   By: Narda Rutherford M.D.   On: 10/07/2022 12:23    Procedures Procedures    Medications Ordered in ED Medications  Tdap (BOOSTRIX) injection 0.5 mL (0.5 mLs Intramuscular Given 10/07/22 1208)  HYDROcodone-acetaminophen (NORCO/VICODIN) 5-325 MG per tablet 2 tablet (2 tablets Oral Given 10/07/22 1209)     ED Course/ Medical Decision Making/ A&P                                 Medical Decision Making Patient complains of a laceration to his left distal finger.  Patient reports he injured his finger at work  Amount and/or Complexity of Data Reviewed Radiology: ordered and independent interpretation performed. Decision-making details documented in ED Course.    Details: Spray ordered reviewed and interpreted there is a distal tuft fracture to the left fifth finger Discussion of management or test interpretation with external provider(s): Dale Taylor, PA see with hand surgery evaluated wound.  He advised wound dressing splint and follow-up with Dr. Frazier Butt in 1 week.  Risk Prescription drug management.           Final Clinical Impression(s) / ED Diagnoses Final diagnoses:  Laceration of left little finger without foreign body with damage to nail, initial encounter  Closed displaced fracture of phalanx of left little finger, unspecified phalanx, initial encounter    Rx / DC Orders ED Discharge Orders          Ordered    HYDROcodone-acetaminophen (NORCO/VICODIN) 5-325 MG tablet  Every 4 hours PRN        10/07/22 1335          An After Visit Summary was printed and given to the patient.     Elson Areas, Cordelia Poche 10/07/22 1353    Benjiman Core, MD 10/07/22 1526
